# Patient Record
Sex: Female | Born: 1990 | Race: White | Hispanic: No | Marital: Single | Smoking: Never smoker
Health system: Southern US, Community
[De-identification: ages and names within clinical notes are randomized; demographics above are authoritative.]

## PROBLEM LIST (undated history)

## (undated) DIAGNOSIS — G589 Mononeuropathy, unspecified: Secondary | ICD-10-CM

## (undated) HISTORY — PX: ADENOIDECTOMY: SUR15

## (undated) HISTORY — PX: MYRINGOTOMY: SHX2060

## (undated) HISTORY — PX: TONSILLECTOMY: SUR1361

## (undated) HISTORY — PX: TONSILECTOMY, ADENOIDECTOMY, BILATERAL MYRINGOTOMY AND TUBES: SHX2538

---

## 2011-01-01 ENCOUNTER — Emergency Department (HOSPITAL_COMMUNITY)
Admission: EM | Admit: 2011-01-01 | Discharge: 2011-01-01 | Disposition: A | Discharge status: Home / Self Care | Payer: PRIVATE HEALTH INSURANCE | Attending: Emergency Medicine | Admitting: Emergency Medicine

## 2011-01-01 DIAGNOSIS — R55 Syncope and collapse: Secondary | ICD-10-CM | POA: Insufficient documentation

## 2011-01-01 LAB — DIFFERENTIAL
Basophils Absolute: 0 10*3/uL (ref 0.0–0.1)
Lymphocytes Relative: 13 % (ref 12–46)
Lymphs Abs: 1.7 10*3/uL (ref 0.7–4.0)
Monocytes Absolute: 0.8 10*3/uL (ref 0.1–1.0)
Monocytes Relative: 6 % (ref 3–12)
Neutro Abs: 10.7 10*3/uL — ABNORMAL HIGH (ref 1.7–7.7)

## 2011-01-01 LAB — BASIC METABOLIC PANEL
BUN: 11 mg/dL (ref 6–23)
GFR calc non Af Amer: >90 mL/min (ref >90)
Glucose, Bld: 93 mg/dL (ref 70–99)
Potassium: 3.9 mEq/L (ref 3.5–5.1)

## 2011-01-01 LAB — CBC
HCT: 38.4 % (ref 36.0–46.0)
Hemoglobin: 13.7 g/dL (ref 12.0–15.0)
MCH: 30.9 pg (ref 26.0–34.0)
MCHC: 35.7 g/dL (ref 30.0–36.0)
MCV: 86.5 fL (ref 78.0–100.0)

## 2011-01-18 ENCOUNTER — Encounter: Payer: Self-pay | Admitting: Emergency Medicine

## 2011-01-18 ENCOUNTER — Other Ambulatory Visit: Payer: Self-pay

## 2011-01-18 ENCOUNTER — Emergency Department (HOSPITAL_COMMUNITY)
Admission: EM | Admit: 2011-01-18 | Discharge: 2011-01-18 | Disposition: A | Discharge status: Home / Self Care | Payer: PRIVATE HEALTH INSURANCE | Attending: Emergency Medicine | Admitting: Emergency Medicine

## 2011-01-18 DIAGNOSIS — R55 Syncope and collapse: Secondary | ICD-10-CM | POA: Insufficient documentation

## 2011-01-18 DIAGNOSIS — Z9889 Other specified postprocedural states: Secondary | ICD-10-CM | POA: Insufficient documentation

## 2011-01-18 DIAGNOSIS — R42 Dizziness and giddiness: Secondary | ICD-10-CM | POA: Insufficient documentation

## 2011-01-18 DIAGNOSIS — R0602 Shortness of breath: Secondary | ICD-10-CM | POA: Insufficient documentation

## 2011-01-18 LAB — URINALYSIS, ROUTINE W REFLEX MICROSCOPIC
Glucose, UA: NEGATIVE mg/dL
Hgb urine dipstick: NEGATIVE
Protein, ur: NEGATIVE mg/dL

## 2011-01-18 LAB — DIFFERENTIAL
Eosinophils Relative: 2 % (ref 0–5)
Lymphocytes Relative: 43 % (ref 12–46)
Lymphs Abs: 3 10*3/uL (ref 0.7–4.0)
Monocytes Absolute: 0.7 10*3/uL (ref 0.1–1.0)

## 2011-01-18 LAB — POCT I-STAT, CHEM 8
BUN: 7 mg/dL (ref 6–23)
Creatinine, Ser: 0.7 mg/dL (ref 0.50–1.10)
Potassium: 3.3 mEq/L — ABNORMAL LOW (ref 3.5–5.1)
Sodium: 140 mEq/L (ref 135–145)

## 2011-01-18 LAB — CBC
HCT: 34.9 % — ABNORMAL LOW (ref 36.0–46.0)
MCV: 85.1 fL (ref 78.0–100.0)
RBC: 4.1 MIL/uL (ref 3.87–5.11)
WBC: 7 10*3/uL (ref 4.0–10.5)

## 2011-01-18 MED ORDER — SODIUM CHLORIDE 0.9 % IV BOLUS (SEPSIS)
1000.0000 mL | Freq: Once | INTRAVENOUS | Status: AC
  Administered 2011-01-18: 1000 mL via INTRAVENOUS

## 2011-01-18 NOTE — ED Notes (Signed)
Pt states she has not felt right all day  Pt states she has felt weak and tired  Pt states she worked [employer] and had to go outside to get some fresh air a couple times  Pt states she was seen here a few weeks ago for syncope and states tonight she felt like she was going to but did not  Pt states she was home alone and decided to come in for evaluation  Pt states when she stands she becomes dizzy

## 2011-01-18 NOTE — ED Notes (Signed)
Discharging to home

## 2011-01-18 NOTE — ED Notes (Signed)
EKG DONE AND ORTHSTATICS

## 2011-01-18 NOTE — ED Provider Notes (Signed)
History     CSN: [id number] Arrival date & time: 01/18/2011  2:58 AM   First MD Initiated Contact with Patient 01/18/11 [phone]      Chief Complaint  Patient presents with  . Near Syncope  . Shortness of Breath    (Consider location/radiation/quality/duration/timing/severity/associated sxs/prior treatment) Patient is a 20 y.o. female presenting with shortness of breath. The history is provided by the patient.  Shortness of Breath  The problem occurs occasionally. Associated symptoms include shortness of breath. Pertinent negatives include no chest pain.   patient is having episodes of shortness of breath and syncope and near syncope. She seen in ER couple weeks ago for the same thing. No no fevers. No nausea vomiting diarrhea. She states she's lost 50 pounds in the last few months without trying. His been no change her diet. She states that the episodes come on when she is sitting down and stands. She is no chest pain. Her periods have not been heavy. She would like to go is going on.  History reviewed. No pertinent past medical history.  Past Surgical History  Procedure Date  . Tonsillectomy   . Myringotomy   . Adenoidectomy     Family History  Problem Relation Age of Onset  . Hypertension Mother   . Stroke Father   . Heart attack Other     History  Substance Use Topics  . Smoking status: Never Smoker   . Smokeless tobacco: Not on file  . Alcohol Use: No    OB History    Grav Para Term Preterm Abortions TAB SAB Ect Mult Living                  Review of Systems  Constitutional: Positive for unexpected weight change. Negative for activity change and appetite change.  HENT: Negative for neck stiffness.   Eyes: Negative for pain.  Respiratory: Positive for shortness of breath. Negative for chest tightness.   Cardiovascular: Negative for chest pain and leg swelling.  Gastrointestinal: Negative for nausea, vomiting, abdominal pain and diarrhea.  Genitourinary:  Negative for flank pain.  Musculoskeletal: Negative for back pain.  Skin: Negative for rash.  Neurological: Positive for light-headedness. Negative for dizziness, weakness, numbness and headaches.  Psychiatric/Behavioral: Negative for behavioral problems.    Allergies  Review of patient's allergies indicates no known allergies.  Home Medications  No current outpatient prescriptions on file.  BP 117/72  Pulse 60  Temp(Src) 99.1 F (37.3 C) (Oral)  Resp 16  SpO2 100%  LMP 01/12/2011  Physical Exam  Nursing note and vitals reviewed. Constitutional: She is oriented to person, place, and time. She appears well-developed and well-nourished.  HENT:  Head: Normocephalic and atraumatic.  Eyes: EOM are normal. Pupils are equal, round, and reactive to light.  Neck: Normal range of motion. Neck supple. No thyromegaly present.  Cardiovascular: Normal rate, regular rhythm and normal heart sounds.   No murmur heard. Pulmonary/Chest: Effort normal and breath sounds normal. No respiratory distress. She has no wheezes. She has no rales.  Abdominal: Soft. Bowel sounds are normal. She exhibits no distension. There is no tenderness. There is no rebound and no guarding.  Musculoskeletal: Normal range of motion.  Neurological: She is alert and oriented to person, place, and time. No cranial nerve deficit.  Skin: Skin is warm and dry.  Psychiatric: She has a normal mood and affect. Her speech is normal.    ED Course  Procedures (including critical care time)  Labs Reviewed  URINALYSIS,  ROUTINE W REFLEX MICROSCOPIC - Abnormal; Notable for the following:    Ketones, ur TRACE (*)    All other components within normal limits  POCT I-STAT, CHEM 8 - Abnormal; Notable for the following:    Potassium 3.3 (*)    All other components within normal limits  CBC - Abnormal; Notable for the following:    HCT 34.9 (*)    All other components within normal limits  PREGNANCY, URINE  D-DIMER, QUANTITATIVE    DIFFERENTIAL  MONONUCLEOSIS SCREEN  I-STAT, CHEM 8   No results found.   1. Vasovagal near syncope      Date: 01/18/2011  Rate: 63  Rhythm: normal sinus rhythm  QRS Axis: normal  Intervals: normal  ST/T Wave abnormalities: normal  Conduction Disutrbances:none  Narrative Interpretation:   Old EKG Reviewed: none available    MDM  Patient presents to the ER with episodes of syncope and near syncope after standing. She's been seen in t starthe ER for the same previously. She has some shortness of breath with the episodes. No chest pain. She states it may come on more when she eats less. No nausea or vomiting. No fevers. She's not pregnant. Her EKG is reassuring. If her d-dimer was negative. Her white count has gone from 13 down to normal. You had had it is likely near syncope.        Juliet Rude. Rubin Payor, MD 01/18/11 [phone]

## 2011-01-18 NOTE — ED Notes (Signed)
RAINBOW on hold in lab

## 2011-05-06 ENCOUNTER — Encounter (INDEPENDENT_AMBULATORY_CARE_PROVIDER_SITE_OTHER): Payer: PRIVATE HEALTH INSURANCE | Admitting: Obstetrics and Gynecology

## 2011-05-06 DIAGNOSIS — Z01419 Encounter for gynecological examination (general) (routine) without abnormal findings: Secondary | ICD-10-CM

## 2011-05-08 ENCOUNTER — Encounter: Payer: PRIVATE HEALTH INSURANCE | Admitting: Obstetrics and Gynecology

## 2011-07-20 ENCOUNTER — Emergency Department (HOSPITAL_COMMUNITY): Payer: PRIVATE HEALTH INSURANCE

## 2011-07-20 ENCOUNTER — Emergency Department (HOSPITAL_COMMUNITY)
Admission: EM | Admit: 2011-07-20 | Discharge: 2011-07-20 | Disposition: A | Discharge status: Home / Self Care | Payer: PRIVATE HEALTH INSURANCE | Attending: Emergency Medicine | Admitting: Emergency Medicine

## 2011-07-20 ENCOUNTER — Encounter (HOSPITAL_COMMUNITY): Payer: Self-pay | Admitting: *Deleted

## 2011-07-20 DIAGNOSIS — M25551 Pain in right hip: Secondary | ICD-10-CM

## 2011-07-20 DIAGNOSIS — M25559 Pain in unspecified hip: Secondary | ICD-10-CM | POA: Insufficient documentation

## 2011-07-20 HISTORY — DX: Mononeuropathy, unspecified: G58.9

## 2011-07-20 MED ORDER — IBUPROFEN 800 MG PO TABS
800.0000 mg | ORAL_TABLET | Freq: Once | ORAL | Status: AC
  Administered 2011-07-20: 800 mg via ORAL
  Filled 2011-07-20: qty 1

## 2011-07-20 MED ORDER — HYDROCODONE-ACETAMINOPHEN 5-325 MG PO TABS: 1.0000 | ORAL_TABLET | ORAL | Status: AC | PRN

## 2011-07-20 NOTE — Discharge Instructions (Signed)
Read the information below.  Take the pain medication as needed.  You may also take up to 800mg  ibuprofen (4 regular pills) three times a day if you need extra pain control.  Do not take additional tylenol while taking the prescribed pain medicine.  If you continue to have pain this week, follow up with your primary care provider.  If it gets worse, you may also contact the orthopedic surgeon listed above.  You may return to the ER at any time for worsening condition or any new symptoms that concern you.   Arthralgia Your caregiver has diagnosed you as suffering from an arthralgia. Arthralgia means there is pain in a joint. This can come from many reasons including:  Bruising the joint which causes soreness (inflammation) in the joint.   Wear and tear on the joints which occur as we grow older (osteoarthritis).   Overusing the joint.   Various forms of arthritis.   Infections of the joint.  Regardless of the cause of pain in your joint, most of these different pains respond to anti-inflammatory drugs and rest. The exception to this is when a joint is infected, and these cases are treated with antibiotics, if it is a bacterial infection. HOME CARE INSTRUCTIONS   Rest the injured area for as long as directed by your caregiver. Then slowly start using the joint as directed by your caregiver and as the pain allows. Crutches as directed may be useful if the ankles, knees or hips are involved. If the knee was splinted or casted, continue use and care as directed. If an stretchy or elastic wrapping bandage has been applied today, it should be removed and re-applied every 3 to 4 hours. It should not be applied tightly, but firmly enough to keep swelling down. Watch toes and feet for swelling, bluish discoloration, coldness, numbness or excessive pain. If any of these problems (symptoms) occur, remove the ace bandage and re-apply more loosely. If these symptoms persist, contact your caregiver or return to  this location.   For the first 24 hours, keep the injured extremity elevated on pillows while lying down.   Apply ice for 15 to 20 minutes to the sore joint every couple hours while awake for the first half day. Then 3 to 4 times per day for the first 48 hours. Put the ice in a plastic bag and place a towel between the bag of ice and your skin.   Wear any splinting, casting, elastic bandage applications, or slings as instructed.   Only take over-the-counter or prescription medicines for pain, discomfort, or fever as directed by your caregiver. Do not use aspirin immediately after the injury unless instructed by your physician. Aspirin can cause increased bleeding and bruising of the tissues.   If you were given crutches, continue to use them as instructed and do not resume weight bearing on the sore joint until instructed.  Persistent pain and inability to use the sore joint as directed for more than 2 to 3 days are warning signs indicating that you should see a caregiver for a follow-up visit as soon as possible. Initially, a hairline fracture (break in bone) may not be evident on X-rays. Persistent pain and swelling indicate that further evaluation, non-weight bearing or use of the joint (use of crutches or slings as instructed), or further X-rays are indicated. X-rays may sometimes not show a small fracture until a week or 10 days later. Make a follow-up appointment with your own caregiver or one to whom  we have referred you. A radiologist (specialist in reading X-rays) may read your X-rays. Make sure you know how you are to obtain your X-ray results. Do not assume everything is normal if you do not hear from [place]. SEEK MEDICAL CARE IF: Bruising, swelling, or pain increases. SEEK IMMEDIATE MEDICAL CARE IF:   Your fingers or toes are numb or blue.   The pain is not responding to medications and continues to stay the same or get worse.   The pain in your joint becomes severe.   You develop a  fever over 102 F (38.9 C).   It becomes impossible to move or use the joint.  MAKE SURE YOU:   Understand these instructions.   Will watch your condition.   Will get help right away if you are not doing well or get worse.  Document Released: 02/24/2005 Document Revised: 02/13/2011 Document Reviewed: 10/13/2007 [facility] Patient Information 2012 [place], [place].  Hip Pain The hips join the upper legs to the lower pelvis. The bones, cartilage, tendons, and muscles of the hip joint perform a lot of work each day holding your body weight and allowing you to move around. Hip pain is a common symptom. It can range from a minor ache to severe pain on 1 or both hips. Pain may be felt on the inside of the hip joint near the groin, or the outside near the buttocks and upper thigh. There may be swelling or stiffness as well. It occurs more often when a person walks or performs activity. There are many reasons hip pain can develop. CAUSES  It is important to work with your caregiver to identify the cause since many conditions can impact the bones, cartilage, muscles, and tendons of the hips. Causes for hip pain include:  Broken (fractured) bones.   Separation of the thighbone from the hip socket (dislocation).   Torn cartilage of the hip joint.   Swelling (inflammation) of a tendon (tendonitis), the sac within the hip joint (bursitis), or a joint.   A weakening in the abdominal wall (hernia), affecting the nerves to the hip.   Arthritis in the hip joint or lining of the hip joint.   Pinched nerves in the back, hip, or upper thigh.   A bulging disc in the spine (herniated disc).   Rarely, bone infection or cancer.  DIAGNOSIS  The location of your hip pain will help your caregiver understand what may be causing the pain. A diagnosis is based on your medical history, your symptoms, results from your physical exam, and results from diagnostic tests. Diagnostic tests may include X-ray exams, a  computerized magnetic scan (magnetic resonance imaging, MRI), or bone scan. TREATMENT  Treatment will depend on the cause of your hip pain. Treatment may include:  Limiting activities and resting until symptoms improve.   Crutches or other walking supports (a cane or brace).   Ice, elevation, and compression.   Physical therapy or home exercises.   Shoe inserts or special shoes.   Losing weight.   Medications to reduce pain.   Undergoing surgery.  HOME CARE INSTRUCTIONS   Only take over-the-counter or prescription medicines for pain, discomfort, or fever as directed by your caregiver.   Put ice on the injured area:   Put ice in a plastic bag.   Place a towel between your skin and the bag.   Leave the ice on for 15 to 20 minutes at a time, 3 to 4 times a day.   Keep your  leg raised (elevated) when possible to lessen swelling.   Avoid activities that cause pain.   Follow specific exercises as directed by your caregiver.   Sleep with a pillow between your legs on your most comfortable side.   Record how often you have hip pain, the location of the pain, and what it feels like. This information may be helpful to you and your caregiver.   Ask your caregiver about returning to work or sports and whether you should drive.   Follow up with your caregiver for further exams, therapy, or testing as directed.  SEEK MEDICAL CARE IF:   Your pain or swelling continues or worsens after 1 week.   You are feeling unwell or have chills.   You have increasing difficulty with walking.   You have a loss of sensation or other new symptoms.   You have questions or concerns.  SEEK IMMEDIATE MEDICAL CARE IF:   You cannot put weight on the affected hip.   You have fallen.   You have a sudden increase in pain and swelling in your hip.   You have a fever.  MAKE SURE YOU:   Understand these instructions.   Will watch your condition.   Will get help right away if you are not  doing well or get worse.  Document Released: 08/14/2009 Document Revised: 02/13/2011 Document Reviewed: 08/14/2009 [facility] Patient Information 2012 [place], [place].  If you have no primary doctor, here are some resources that may be helpful:  Medicaid-accepting [facility] Providers:   - [facility]- [address]      [phone]      Mon-Fri 9am-7pm, Sat 9am-1pm   - [facility]- [address] [place]      [phone]   - [facility]- [address], [address] [place]      [phone]   [facility]- [address]      [phone]   - Renaye Rakers- [address] [place], [address]      [phone]      Only accepts [place] patients       after they have her name applied to their card   Self Pay (no insurance) in [place]:   - Sickle Cell Patients: Dr Willey Blade, [facility]      [address] [place]      [phone]   - [phone]   - Physician Referral Service- [phone]   - [facility]- [address] [place]      [phone]   [facility] [place]- [address]   - [facility]- see information above      (Speak to [employer] if you do not have insurance)   - [facility]- [address] [place]      [phone]   - [facility] [employer]- [address]      [phone]   - [facility]- [address]      [phone]   - Dr Julio Sicks-  [address], [place]      [phone]   - [facility]- [address]      [phone]   - [facility]- [address]      [phone]      Also [address]      [phone]   - [facility]- [address]      [phone]      1st and 3rd Saturday every month, 10am-1pm Other agencies that provide inexpensive medical care:    [facility]  [phone]    [facility] Internal  Medicine  [phone]    [facility]  [phone]    [facility]  [phone]    [facility]  [phone]  General Information: Finding a doctor when you do not have health insurance can be tricky. Although you are not limited by an insurance plan, you are of course limited by her finances and how much but he can pay out of pocket.  What are your options if you don't have health insurance?   1) Find a Librarian, academic and Pay Out of Pocket Although you won't have to find out who is covered by your insurance plan, it is a good idea to ask around and get recommendations. You will then need to call the office and see if the doctor you have chosen will accept you as a new patient and what types of options they offer for patients who are self-pay. Some doctors offer discounts or will set up payment plans for their patients who do not have insurance, but you will need to ask so you aren't surprised when you get to your appointment.  2) Contact Your Local Health Department Not all health departments have doctors that can see patients for sick visits, but many do, so it is worth a call to see if yours does. If you don't know where your local health department is, you can check in your phone book. The CDC also has a tool to help you locate your state's health department, and many state websites also have listings of all of their local health departments.  3) Find a Walk-in Clinic If your illness is not likely to be very severe or complicated, you may want to try a walk in clinic. These are popping up all over the country in pharmacies, drugstores, and shopping centers. They're usually staffed by nurse practitioners or physician assistants that have been trained to treat common illnesses and complaints. They're usually fairly quick and inexpensive. However, if you have serious medical issues or chronic medical problems, these are probably not your best option  RESOURCE GUIDE  Dental Problems  Patients  with Medicaid: [facility] [phone] [address]                                           [phone] [address] [employer] [address] Phone:  [phone]                                                  Phone:  [phone]  If unable to pay or uninsured, contact:  [facility] or [facility]. to become qualified for the adult dental clinic.  Chronic Pain Problems Contact [facility]  [phone] Patients need to be referred by their primary care doctor.  Insufficient Money for Medicine Contact United Way:  call "211" or [facility] [phone].  No Primary Care Doctor Call [facility]  [phone] Other agencies that provide inexpensive medical care    [facility]  [phone]    [facility]  [phone]    [facility]  [phone]    [facility]  [phone]    [facility]  [phone]    [facility]  [phone]  Psychological Services [facility]  [phone] [facility]  [phone] [facility]   [phone] (emergency services [phone])  Substance Abuse Resources [facility]  [phone] [facility] [phone] [facility] [place] [phone] Floydene Flock [phone] Residential & Outpatient Substance Abuse Program  [phone]  Abuse/Neglect [facility] Child Abuse Hotline (401)595-0895 [facility] Child Abuse Hotline 207-243-7027 (After Hours)  Emergency [facility] (430)570-2153  Maternity [facility] [place] [facility] [phone] [facility] Services [phone]  MRSA Hotline #:   801-469-3957    [facility] Resources  [facility]     [employer]                          [facility] Dept. [address] [place]                       [address]      [address]  Blondell Reveal Phone:  [phone]                                   Phone:  [phone]                 Phone:  [phone]  [facility] Mental Health Phone:  [phone]  [facility] Child Abuse Hotline 716-851-0532 3061433200 (After Hours)

## 2011-07-20 NOTE — ED Provider Notes (Signed)
History     CSN: [id number]  Arrival date & time 07/20/11  1313   First MD Initiated Contact with Patient 07/20/11 1431      Chief Complaint  Patient presents with  . Leg Pain    right    (Consider location/radiation/quality/duration/timing/severity/associated sxs/prior treatment) HPI Comments: Patient reports she began having right hip and right thigh pain last night.  Pain is aching and sharp, worse with movement.  Denies injury, fevers, recent illness.  Pt has no medical problems, was treated for chlamydia 5 months ago but since has not had any abnormal vaginal discharge or concerning symptoms.  Denies leg swelling, no family or personal hx blood clots, no recent immobilization.  Denies weakness or numbness of her legs.    The history is provided by the patient.    Past Medical History  Diagnosis Date  . Pinched nerve     Past Surgical History  Procedure Date  . Tonsillectomy   . Myringotomy   . Adenoidectomy     Family History  Problem Relation Age of Onset  . Hypertension Mother   . Stroke Father   . Heart attack Other     History  Substance Use Topics  . Smoking status: Current Everyday Smoker -- 0.5 packs/day    Types: Cigarettes  . Smokeless tobacco: Never Used  . Alcohol Use: Yes     once a week    OB History    Grav Para Term Preterm Abortions TAB SAB Ect Mult Living                  Review of Systems  Constitutional: Negative for fever.  HENT: Negative for sore throat.   Respiratory: Negative for cough.   Genitourinary: Negative for vaginal discharge.  Neurological: Negative for weakness and numbness.    Allergies  Review of patient's allergies indicates no known allergies.  Home Medications   Current Outpatient Rx  Name Route Sig Dispense Refill  . LEVONORGESTREL-ETHINYL ESTRAD 0.1-20 MG-MCG PO TABS Oral Take 1 tablet by mouth daily.      BP 144/75  Pulse 90  Temp(Src) 98.2 F (36.8 C) (Oral)  Resp 18  Wt 108 lb (48.988 kg)   SpO2 100%  LMP 05/04/2011  Physical Exam  Nursing note and vitals reviewed. Constitutional: She is oriented to person, place, and time. She appears well-developed and well-nourished.  HENT:  Head: Normocephalic and atraumatic.  Neck: Neck supple.  Pulmonary/Chest: Effort normal.  Musculoskeletal: She exhibits no edema.       Right hip: She exhibits decreased range of motion. She exhibits normal strength, no bony tenderness, no swelling, no crepitus, no deformity and no laceration.       Right knee: Normal.       Right ankle: Normal.       Cervical back: Normal.       Thoracic back: Normal.       Lumbar back: Normal.       Right lower leg: She exhibits no tenderness and no swelling.       Legs:      Right hip pain with abduction.    Lower extremities: strength 5/5, sensation intact, distal pulses intact.   Neurological: She is alert and oriented to person, place, and time.  Skin: No rash noted. She is not diaphoretic.  Psychiatric: She has a normal mood and affect. Her behavior is normal. Judgment and thought content normal.    ED Course  Procedures (including critical care time)  Labs Reviewed - No data to display Dg Hip Complete Right  07/20/2011  *RADIOLOGY REPORT*  Clinical Data: Leg pain, hip pain  RIGHT HIP - COMPLETE 2+ VIEW  Comparison: None.  Findings: Three views of the right hip submitted.  No acute fracture or subluxation.  IMPRESSION: No acute fracture or subluxation.  Original Report Authenticated By: Natasha Mead, M.D.     1. Right hip pain       MDM  Patient with right hip pain that began last night.  No injury, no fever, no recent illness.  Doubt septic joint.  No swelling or tenderness or redness concerning for DVT.  Mild muscular tenderness and pain with Abduction of right hip.  Xray negative.  Pt d/c home with pain medication, PCP/ortho follow up.  Return precautions discussed.  Patient verbalizes understanding and agrees with plan.          Dillard Cannon  [place], [place] 07/20/11 1559

## 2011-07-20 NOTE — ED Notes (Signed)
Pt from home with reports of right hip pain with radiation up and down right leg and body. Pt reports that pain was first noticed last night but radiation began this morning. Pt denies injury or surgery.

## 2011-07-29 NOTE — ED Provider Notes (Signed)
Medical screening examination/treatment/procedure(s) were performed by non-physician practitioner and as supervising physician I was immediately available for consultation/collaboration.    Nelia Shi, MD 07/29/11 1640

## 2012-10-05 ENCOUNTER — Encounter (HOSPITAL_BASED_OUTPATIENT_CLINIC_OR_DEPARTMENT_OTHER): Payer: Self-pay | Admitting: Emergency Medicine

## 2012-10-05 ENCOUNTER — Emergency Department (HOSPITAL_BASED_OUTPATIENT_CLINIC_OR_DEPARTMENT_OTHER)
Admission: EM | Admit: 2012-10-05 | Discharge: 2012-10-05 | Disposition: A | Discharge status: Home / Self Care | Payer: Worker's Compensation | Attending: Emergency Medicine | Admitting: Emergency Medicine

## 2012-10-05 ENCOUNTER — Emergency Department (HOSPITAL_BASED_OUTPATIENT_CLINIC_OR_DEPARTMENT_OTHER): Payer: Worker's Compensation

## 2012-10-05 DIAGNOSIS — F17200 Nicotine dependence, unspecified, uncomplicated: Secondary | ICD-10-CM | POA: Insufficient documentation

## 2012-10-05 DIAGNOSIS — Z79899 Other long term (current) drug therapy: Secondary | ICD-10-CM | POA: Insufficient documentation

## 2012-10-05 DIAGNOSIS — Z8669 Personal history of other diseases of the nervous system and sense organs: Secondary | ICD-10-CM | POA: Insufficient documentation

## 2012-10-05 DIAGNOSIS — S51009A Unspecified open wound of unspecified elbow, initial encounter: Secondary | ICD-10-CM | POA: Insufficient documentation

## 2012-10-05 DIAGNOSIS — Y990 Civilian activity done for income or pay: Secondary | ICD-10-CM | POA: Insufficient documentation

## 2012-10-05 DIAGNOSIS — S51012A Laceration without foreign body of left elbow, initial encounter: Secondary | ICD-10-CM

## 2012-10-05 DIAGNOSIS — Y92009 Unspecified place in unspecified non-institutional (private) residence as the place of occurrence of the external cause: Secondary | ICD-10-CM | POA: Insufficient documentation

## 2012-10-05 DIAGNOSIS — W1809XA Striking against other object with subsequent fall, initial encounter: Secondary | ICD-10-CM | POA: Insufficient documentation

## 2012-10-05 DIAGNOSIS — Y9389 Activity, other specified: Secondary | ICD-10-CM | POA: Insufficient documentation

## 2012-10-05 MED ORDER — OXYCODONE-ACETAMINOPHEN 5-325 MG PO TABS
2.0000 | ORAL_TABLET | Freq: Once | ORAL | Status: DC
  Filled 2012-10-05: qty 2

## 2012-10-05 MED ORDER — LIDOCAINE-EPINEPHRINE 2 %-1:100000 IJ SOLN
INTRAMUSCULAR | Status: AC
  Filled 2012-10-05: qty 1

## 2012-10-05 MED ORDER — OXYCODONE-ACETAMINOPHEN 5-325 MG PO TABS
ORAL_TABLET | ORAL | Status: AC
  Filled 2012-10-05: qty 2

## 2012-10-05 NOTE — ED Notes (Signed)
Pt reports falling at work, walking into kitchen area, slipped on water and fell to floor, denies hitting head, states she fell on buttocks, no leg back or neck pain

## 2012-10-05 NOTE — ED Provider Notes (Signed)
CSN: [id number]     Arrival date & time 10/05/12  2027 History     First MD Initiated Contact with Patient 10/05/12 2108     Chief Complaint  Patient presents with  . Laceration   (Consider location/radiation/quality/duration/timing/severity/associated sxs/prior Treatment) Patient is a 22 y.o. female presenting with skin laceration.  Laceration  Pt reports she slipped and fell at work just prior to arrival landing on her buttocks and hitting her L elbow on the ground, sustaining laceration to L elbow. Complaining of moderate to severe aching pain worse with movement.  No head injury or LOC.   Past Medical History  Diagnosis Date  . Pinched nerve    Past Surgical History  Procedure Laterality Date  . Tonsillectomy    . Myringotomy    . Adenoidectomy     Family History  Problem Relation Age of Onset  . Hypertension Mother   . Stroke Father   . Heart attack Other    History  Substance Use Topics  . Smoking status: Current Every Day Smoker -- 0.50 packs/day    Types: Cigarettes  . Smokeless tobacco: Never Used  . Alcohol Use: Yes     Comment: once a week   OB History   Grav Para Term Preterm Abortions TAB SAB Ect Mult Living                 Review of Systems All other systems reviewed and are negative except as noted in HPI.   Allergies  Review of patient's allergies indicates no known allergies.  Home Medications   Current Outpatient Rx  Name  Route  Sig  Dispense  Refill  . levonorgestrel-ethinyl estradiol (AVIANE,ALESSE,LESSINA) 0.1-20 MG-MCG tablet   Oral   Take 1 tablet by mouth daily.          BP 111/99  Pulse 86  Temp(Src) 98.4 F (36.9 C) (Oral)  Resp 16  Ht 5\' 4"  (1.626 m)  Wt 107 lb (48.535 kg)  BMI 18.36 kg/m2  SpO2 100%  LMP 09/14/2012 Physical Exam  Nursing note and vitals reviewed. Constitutional: She is oriented to person, place, and time. She appears well-developed and well-nourished.  HENT:  Head: Normocephalic and atraumatic.   Eyes: EOM are normal. Pupils are equal, round, and reactive to light.  Neck: Normal range of motion. Neck supple.  Cardiovascular: Normal rate, normal heart sounds and intact distal pulses.   Pulmonary/Chest: Effort normal and breath sounds normal.  Abdominal: Bowel sounds are normal. She exhibits no distension. There is no tenderness.  Musculoskeletal: She exhibits tenderness.  Laceration x 2 L posterior elbow; pain with ROM  Neurological: She is alert and oriented to person, place, and time. She has normal strength. No cranial nerve deficit or sensory deficit.  Skin: Skin is warm and dry. No rash noted.  Psychiatric: She has a normal mood and affect.    ED Course   Procedures (including critical care time) LACERATION REPAIR #1 Performed by: Pollyann Savoy. Consent: Verbal consent obtained. Risks and benefits: risks, benefits and alternatives were discussed Patient identity confirmed: provided demographic data Time out performed prior to procedure Prepped and Draped in normal sterile fashion Wound explored Laceration Location: L elbow Laceration Length: 2cm No Foreign Bodies seen or palpated Anesthesia: local infiltration Local anesthetic: lidocaine 2% with epinephrine Anesthetic total: 2 ml Irrigation method: syringe Amount of cleaning: standard Skin closure: 4-0 nylon Number of sutures or staples: 1 Technique: horizontal mattress Patient tolerance: Patient tolerated the procedure well with no  immediate complications.  LACERATION REPAIR #2 Performed by: Pollyann Savoy. Consent: Verbal consent obtained. Risks and benefits: risks, benefits and alternatives were discussed Patient identity confirmed: provided demographic data Time out performed prior to procedure Prepped and Draped in normal sterile fashion Wound explored Laceration Location: L elbow Laceration Length: 2cm No Foreign Bodies seen or palpated Anesthesia: local infiltration Local anesthetic:  lidocaine 2% with epinephrine Anesthetic total: 2 ml Irrigation method: syringe Amount of cleaning: standard Skin closure: 4-0 nylon Number of sutures or staples: 1 Technique: horizontal mattress Patient tolerance: Patient tolerated the procedure well with no immediate complications. Labs Reviewed - No data to display Dg Elbow Complete Left  10/05/2012   *RADIOLOGY REPORT*  Clinical Data: Laceration secondary to fall at work.  LEFT ELBOW - COMPLETE 3+ VIEW  Comparison: None.  Findings: There is a laceration overlying the olecranon process. No radiodense foreign body.  No osseous abnormality including fracture.  No joint effusion.  IMPRESSION: Posterior elbow laceration.  No radiodense foreign body or osseous abnormality.   Original Report Authenticated By: Tiburcio Pea   1. Laceration of left elbow, initial encounter     MDM  Lacerations not into deep structures. Wound care instructions given.   Romey Cohea B. Bernette Mayers, MD 10/05/12 2314

## 2012-10-05 NOTE — ED Notes (Signed)
Pt only wanted one percocet. Given one percocet and other returned

## 2013-03-10 NOTE — L&D Delivery Note (Signed)
Delivery Information for Girl [place]    Labor Events:    Preterm labor: No   Cervical ripening:                         Rupture date: 11/21/2013   Rupture time: 12:05 AM   Rupture type: Intact;Spontaneous   Fluid Color: Clear   Fluid Odor         Risk Factors:                                  None            Delivery Complications:                                         None         Vaginal Delivery Counts    Initial Count Correct?:      Next Count Correct?:      Relief Count Correct?:      Other Count Correct?:     Final Count Correct?:          Presentation/Position     Breech                   Delivery (Newborn)    FHR in O.R.: 180     Delivery Rm Temp: 66     Date of birth: 11/21/2013   Time of birth: 3:42 AM   Sex: female   GA: Gestational Age: [redacted]w[redacted]d   Delivery Type: Cesarean         C/S Incidence:  Primary      C/S Incision Type:  Lower Uterine Transverse           VBAC Attempted:  No          Delivery Assist by:     Number of pulls:     Vacuum Type:     Forceps Type:         Delivery Maneuvers    Non-Dystocia Maneuver:                            :         Delivery (Maternal)    Episiotomy: None   Lacerations: None     Laceration Type:      Episiotomy/Laceration Repair: N/A   Blood Loss (mL): 500   Uterus Explored: Yes     Anesthesia Method: Spinal            Newborn Assessment    Living at birth: Yes          APGARS  One minute Five minutes Ten minutes   Skin color: 1   1       Heart rate: 2   2       Grimace: 2   2       Muscle tone: 2   2       Breathing: 2   2       Totals: 9  9              Resuscitation    Resuscitation: Tactile Stimulation          Suction: Bulb     Prior to DEL of chest:  Newborn Measurements    Weight: 7 lb 13.6 oz (3560 g)   Length: 20.5"   Observed Anomalies:        Cord Information    Vessels: 3 vessels   Complications: None   Nuchal Cord:     Cut before DEL of shoulder:     Blood gases sent? No   Cord Tissue Sample: No         Placenta Information    Placenta Removal:  Manual removal     Placenta Appearance: Intact       Culture/Pathology    Cultures obtained: None          Pathology Specimens: None                    Lorenza Evangelist MD

## 2013-09-11 LAB — RPR: RPR: NONREACTIVE

## 2013-09-11 LAB — GONOCOCCUS CULTURE
Chlamydia trachomatis Culture: NEGATIVE
Culture Gonorrhoeae: NEGATIVE

## 2013-09-11 LAB — HEPATITIS B SURFACE ANTIGEN W/ REFLEX TO CONFIRMATION: Hepatitis B Surface Antigen: NEGATIVE

## 2013-09-11 LAB — HIV AG/AB 4TH GENERATION: HIV Ag/Ab, 4th Generation: NEGATIVE

## 2013-09-11 LAB — GROUP B STREP TRANSCRIBED: GBS Transcribed: POSITIVE

## 2013-09-11 LAB — RUBELLA ANTIBODY, IGG: Rubella AB, IgG: IMMUNE

## 2013-11-11 ENCOUNTER — Observation Stay: Payer: Medicaid Other

## 2013-11-11 ENCOUNTER — Observation Stay
Admission: AD | Admit: 2013-11-11 | Discharge: 2013-11-11 | Disposition: A | Discharge status: Home / Self Care | Payer: Medicaid Other | Source: Ambulatory Visit | Attending: Obstetrics and Gynecology | Admitting: Obstetrics and Gynecology

## 2013-11-11 ENCOUNTER — Observation Stay: Payer: Medicaid Other | Admitting: Obstetrics and Gynecology

## 2013-11-11 DIAGNOSIS — O139 Gestational [pregnancy-induced] hypertension without significant proteinuria, unspecified trimester: Principal | ICD-10-CM | POA: Insufficient documentation

## 2013-11-11 DIAGNOSIS — Z3480 Encounter for supervision of other normal pregnancy, unspecified trimester: Secondary | ICD-10-CM

## 2013-11-11 LAB — URIC ACID: Uric acid: 6.4 mg/dL — ABNORMAL HIGH (ref 2.6–6.0)

## 2013-11-11 LAB — CBC AND DIFFERENTIAL
Basophils Absolute Automated: 0.02 10*3/uL (ref 0.00–0.20)
Basophils Automated: 0 %
Eosinophils Absolute Automated: 0.18 10*3/uL (ref 0.00–0.70)
Eosinophils Automated: 2 %
Hematocrit: 32.6 % — ABNORMAL LOW (ref 37.0–47.0)
Hgb: 10.8 g/dL — ABNORMAL LOW (ref 12.0–16.0)
Immature Granulocytes Absolute: 0.06 10*3/uL — ABNORMAL HIGH
Immature Granulocytes: 1 %
Lymphocytes Absolute Automated: 2.46 10*3/uL (ref 0.50–4.40)
Lymphocytes Automated: 22 %
MCH: 27.8 pg — ABNORMAL LOW (ref 28.0–32.0)
MCHC: 33.1 g/dL (ref 32.0–36.0)
MCV: 83.8 fL (ref 80.0–100.0)
MPV: 10.9 fL (ref 9.4–12.3)
Monocytes Absolute Automated: 0.86 10*3/uL (ref 0.00–1.20)
Monocytes: 8 %
Neutrophils Absolute: 7.76 10*3/uL (ref 1.80–8.10)
Neutrophils: 69 %
Nucleated RBC: 0 /100 WBC (ref 0–1)
Platelets: 236 10*3/uL (ref 140–400)
RBC: 3.89 10*6/uL — ABNORMAL LOW (ref 4.20–5.40)
RDW: 14 % (ref 12–15)
WBC: 11.28 10*3/uL — ABNORMAL HIGH (ref 3.50–10.80)

## 2013-11-11 LAB — URINALYSIS WITH MICROSCOPIC
Bilirubin, UA: NEGATIVE
Blood, UA: NEGATIVE
Glucose, UA: NEGATIVE
Ketones UA: NEGATIVE
Leukocyte Esterase, UA: NEGATIVE
Nitrite, UA: NEGATIVE
Protein, UR: NEGATIVE
Specific Gravity UA: 1.006 (ref 1.001–1.035)
Urine pH: 6 (ref 5.0–8.0)
Urobilinogen, UA: NEGATIVE mg/dL

## 2013-11-11 LAB — GFR: EGFR: >60

## 2013-11-11 LAB — COMPREHENSIVE METABOLIC PANEL
ALT: 8 U/L (ref 0–55)
AST (SGOT): 20 U/L (ref 5–34)
Albumin/Globulin Ratio: 0.8 — ABNORMAL LOW (ref 0.9–2.2)
Albumin: 2.7 g/dL — ABNORMAL LOW (ref 3.5–5.0)
Alkaline Phosphatase: 140 U/L — ABNORMAL HIGH (ref 37–106)
Anion Gap: 9 (ref 5.0–15.0)
BUN: 10 mg/dL (ref 7.0–19.0)
Bilirubin, Total: 0.4 mg/dL (ref 0.2–1.2)
CO2: 20 mEq/L — ABNORMAL LOW (ref 22–29)
Calcium: 9.2 mg/dL (ref 8.5–10.5)
Chloride: 111 mEq/L (ref 100–111)
Creatinine: 0.7 mg/dL (ref 0.6–1.0)
Globulin: 3.6 g/dL (ref 2.0–3.6)
Glucose: 69 mg/dL — ABNORMAL LOW (ref 70–100)
Potassium: 4.1 mEq/L (ref 3.5–5.1)
Protein, Total: 6.3 g/dL (ref 6.0–8.3)
Sodium: 140 mEq/L (ref 136–145)

## 2013-11-11 LAB — PROTEIN / CREATININE RATIO, URINE
Urine Creatinine, Random: 24.1
Urine Protein Random: <7 mg/dL (ref 1.0–14.0)

## 2013-11-11 LAB — PT AND APTT
PT INR: 1 (ref 0.9–1.1)
PT: 13.1 s (ref 12.6–15.0)
PTT: 29 s (ref 23–37)

## 2013-11-11 LAB — FIBRINOGEN: Fibrinogen: 523 mg/dL — ABNORMAL HIGH (ref 189–458)

## 2013-11-11 LAB — LACTATE DEHYDROGENASE: LDH: 180 U/L (ref 125–220)

## 2013-11-11 LAB — HEMOLYSIS INDEX: Hemolysis Index: 4 (ref 0–18)

## 2013-11-11 NOTE — Progress Notes (Signed)
Dr. Katheren Shams called with breech presentation - pt to call on Tuesday to schedule version- afi 14cm- instructions given

## 2013-11-11 NOTE — Discharge Instructions (Signed)
Understanding Preeclampsia  Preeclampsia is a problem that may occur in pregnancy. It can lead to health risks for you and your baby. No one knows what causes preeclampsia. But it almost always goes away soon after you give birth.     Your blood pressure will be monitored regularly throughout your pregnancy to help check for preeclampsia.   Signs and Symptoms  A common sign of preeclampsia is high blood pressure. Other signs and symptoms include:   Edema(swelling) in your face or hands   Rapid weight gain--about 1 pound or more in a day   Protein in your urine   Headache   Abdominal pain on your right side   Vision problems (flashes or spots)  Tests You May Have  Your doctor will want to check your blood pressure throughout your pregnancy. If your blood pressure is high, you may havethe following tests:   Urine tests to look for protein   Blood tests to confirm preeclampsia   Fetal monitoring to ensure that your baby is healthy  Treating Preeclampsia  Preeclampsia almost always ends soon after you give birth. The only cure is delivery. Until then, your doctor can help manage your condition. If your symptoms are mild, you may need bed rest at home. If your symptoms are severe, you will be hospitalized. Hospital treatment includes:   Complete bed rest to help control blood pressure   Magnesium IV (intravenous) drip during labor to prevent seizures   Induced labor or surgical delivery by cesarean section to help you have your baby more quickly  When to Call Your Doctor  Call your doctor or other healthcare provider if swelling, weight gain, or other symptoms come on quickly or are severe. Some cases of preeclampsia are more severe than others. Your signs and symptoms also may change or worsen as you get closer to your due date.  Who's At Risk?  Preeclampsia can occur in any pregnant woman. But if you've had it before, you have a greater chance of it recurring. Also, if you already had high blood  pressure before getting pregnant, your risk for preeclampsia is higher. African Americans, teens, women over 7, and women pregnant with two or more babies are also at greater risk.  Dangers of Preeclampsia  If not treated, preeclampsia can cause problems for you and your baby. The placenta (organ that nourishes your baby) may tear away from the uterine wall. This can lead to fetal distress (the baby is at risk for health problems). Or, your baby may be born too early or too small. Preeclampsia also can cause these health problems:   Kidney failure or other organ damage   Seizures   Stroke  Once You Give Birth  In most cases, preeclampsia goes away on its own soon after you give birth. Within days, your blood pressure should decrease. Other signs and symptoms of preeclampsia also will go away soon.   2000-2014 [employer] [address], [place], [place] [address]. All rights reserved. This information is not intended as a substitute for professional medical care. Always follow your healthcare professional's instructions.        Recognizing Labor  The beginning of labor is the beginning of birth. You'll start to feel strong contractions. That's when the muscles of your uterus tighten up to help push your baby out during birth.    Yes, Labor Has Probably Started If:   Your contractions are getting stronger and more painful instead of weaker. You'll probably  feel them throughout your whole uterus.   Your contractions are regular (you feel them about every 5 to 10 minutes) and they are getting closer together.   You have pink-colored or blood-streaked fluid from your vagina.   Your water breaks. It may be a gush or a slow trickle of clear fluid from your vagina.  No, It's Probably Not Real Labor If:   Your contractions aren't regular or strong.   You feel the contractions only in your lower uterus.   Your contractions go away when you walk or change position.   Your contractions go away after  drinking fluids.  When to Call Your Health Care Provider  Call your doctor or clinic right away if you notice any of these signs:   Fluid from your vagina, with or without contractions.   Bleeding heavy enough that you need a sanitary pad.   You don't feel your baby moving as much as before.    NOTE: Contractions are timed by both of these measures:   The length of each contraction from its start to its finish.   How far apart the contractions are-- the time between the start of one contraction and the start of the next one.    2000-2014 [employer] [address], [place], [place] [address]. All rights reserved. This information is not intended as a substitute for professional medical care. Always follow your healthcare professional's instructions.        Kick Counts  It's normal to worry about your baby's health. One way you can knowyour baby's doing well is to record the baby's movements once a day. This is called a kick count. You will usually feel your baby move by the 20th week of pregnancy. Remember to take your kick count records to all your appointments with your healthcare provider.    How to Count Kicks   Choose a time when the baby is active, such as after a meal.   Sit comfortably or lie on your side.   The first time the baby moves,write downthe time.   Count each movement until the baby has moved10times. This can take from 20 minutes to 2hours.   If the baby hasn't moved 4times in 1 hour,pat your stomach to wake the baby up.   Write down the time you feel the baby's 10thmovement.   Try to do it at the same time each day.  When to Call Your Healthcare Provider  Call your healthcare providerright awayif you notice any of the following:   Your baby moves fewer than 10times in4hours while you're doing kick counts.   Your baby moves much less often than on thedays before.   You have not felt your baby move all day.   2000-2014 [employer]  [address] [place], [place] [address]. All rights reserved. This information is not intended as a substitute for professional medical care. Always follow your healthcare professional's instructions.        FallPrevention  Falls often occur due to slipping, tripping or losing your balance.Here are ways to reduce your risk of falling again.   Was there anything that caused your fall that can be fixed, removed or replaced?   Make your home safe by keeping walkways clear of objects you may trip over.   Use non-slip pads under rugs.   Do not walk in poorly lit areas.   Do not stand on chairs or wobbly ladders.   Use caution when  reaching overhead or looking upward.This position can cause a loss of balance.   Be sure your shoes fit properly, have non-slip bottoms and are in good condition.   Be cautious when going up and down stairs, curbs, and when walking on uneven sidewalks.   If your balance is poor, consider using a cane or walker.   If your fall was related to alcohol use, stop or limit alcohol intake.   If your fall was related to use of sleeping medicines, talk to your doctor about this.You may need to reduce your dosage at bedtime if you awaken during the night to go to the bathroom.   To reduce the need for nighttime bathroom trips:   Avoid drinking fluids for several hours before going to bed   Empty your bladder before going to bed   Men can keep a urinal at the bedside   Stay as active as you can. Balance, flexibility, strength, and endurance all come from exercise. They all play a role in preventing falls. Ask your heathcare provider which types of activity are right for you.   2000-2014 [employer] [address], [place], [place] [address]. All rights reserved. This information is not intended as a substitute for professional medical care. Always follow your healthcare professional's instructions.

## 2013-11-15 ENCOUNTER — Encounter
Admission: AD | Disposition: A | Discharge status: Home / Self Care | Payer: Self-pay | Source: Ambulatory Visit | Attending: Obstetrics and Gynecology

## 2013-11-15 ENCOUNTER — Observation Stay: Payer: Medicaid Other | Admitting: Obstetrics and Gynecology

## 2013-11-15 ENCOUNTER — Observation Stay
Admission: AD | Admit: 2013-11-15 | Discharge: 2013-11-15 | Disposition: A | Discharge status: Home / Self Care | Payer: Medicaid Other | Source: Ambulatory Visit | Attending: Obstetrics and Gynecology | Admitting: Obstetrics and Gynecology

## 2013-11-15 DIAGNOSIS — Z3A38 38 weeks gestation of pregnancy: Secondary | ICD-10-CM

## 2013-11-15 DIAGNOSIS — O321XX0 Maternal care for breech presentation, not applicable or unspecified: Secondary | ICD-10-CM | POA: Insufficient documentation

## 2013-11-15 DIAGNOSIS — O139 Gestational [pregnancy-induced] hypertension without significant proteinuria, unspecified trimester: Principal | ICD-10-CM | POA: Insufficient documentation

## 2013-11-15 DIAGNOSIS — Z3480 Encounter for supervision of other normal pregnancy, unspecified trimester: Secondary | ICD-10-CM

## 2013-11-15 LAB — CBC AND DIFFERENTIAL
Basophils Absolute Automated: 0.02 10*3/uL (ref 0.00–0.20)
Basophils Automated: 0 %
Eosinophils Absolute Automated: 0.15 10*3/uL (ref 0.00–0.70)
Eosinophils Automated: 1 %
Hematocrit: 33.6 % — ABNORMAL LOW (ref 37.0–47.0)
Hgb: 11.2 g/dL — ABNORMAL LOW (ref 12.0–16.0)
Immature Granulocytes Absolute: 0.07 10*3/uL — ABNORMAL HIGH
Immature Granulocytes: 1 %
Lymphocytes Absolute Automated: 2.93 10*3/uL (ref 0.50–4.40)
Lymphocytes Automated: 27 %
MCH: 27.9 pg — ABNORMAL LOW (ref 28.0–32.0)
MCHC: 33.3 g/dL (ref 32.0–36.0)
MCV: 83.8 fL (ref 80.0–100.0)
MPV: 10.8 fL (ref 9.4–12.3)
Monocytes Absolute Automated: 0.82 10*3/uL (ref 0.00–1.20)
Monocytes: 8 %
Neutrophils Absolute: 6.92 10*3/uL (ref 1.80–8.10)
Neutrophils: 64 %
Nucleated RBC: 0 /100 WBC (ref 0–1)
Platelets: 260 10*3/uL (ref 140–400)
RBC: 4.01 10*6/uL — ABNORMAL LOW (ref 4.20–5.40)
RDW: 15 % (ref 12–15)
WBC: 10.84 10*3/uL — ABNORMAL HIGH (ref 3.50–10.80)

## 2013-11-15 LAB — PROTEIN, URINE, 24 HOUR: Urine Volume:: 5250 mL

## 2013-11-15 LAB — CREATININE CLEARANCE
Body Surface Area:: 1.67
Creat Clearance Hours Collected: 24
Creat Clearance-Serum Creatinine: 0.8 mg/dL
Patient Height (In): 64
Urine Creat Clearance Calculation: 105 (ref 66–165)
Urine Volume:: 5250 mL
Weight: 140

## 2013-11-15 LAB — PROTEIN / CREATININE RATIO, URINE
Urine Creatinine, Random: 18.2
Urine Protein Random: <7 mg/dL (ref 1.0–14.0)

## 2013-11-15 LAB — COMPREHENSIVE METABOLIC PANEL
ALT: 9 U/L (ref 0–55)
AST (SGOT): 17 U/L (ref 5–34)
Albumin/Globulin Ratio: 0.8 — ABNORMAL LOW (ref 0.9–2.2)
Albumin: 2.7 g/dL — ABNORMAL LOW (ref 3.5–5.0)
Alkaline Phosphatase: 146 U/L — ABNORMAL HIGH (ref 37–106)
Anion Gap: 10 (ref 5.0–15.0)
BUN: 10 mg/dL (ref 7.0–19.0)
Bilirubin, Total: 0.4 mg/dL (ref 0.2–1.2)
CO2: 21 mEq/L — ABNORMAL LOW (ref 22–29)
Calcium: 10.4 mg/dL (ref 8.5–10.5)
Chloride: 106 mEq/L (ref 100–111)
Creatinine: 0.8 mg/dL (ref 0.6–1.0)
Globulin: 3.5 g/dL (ref 2.0–3.6)
Glucose: 63 mg/dL — ABNORMAL LOW (ref 70–100)
Potassium: 3.8 mEq/L (ref 3.5–5.1)
Protein, Total: 6.2 g/dL (ref 6.0–8.3)
Sodium: 137 mEq/L (ref 136–145)

## 2013-11-15 LAB — URINALYSIS WITH MICROSCOPIC
Bilirubin, UA: NEGATIVE
Glucose, UA: NEGATIVE
Ketones UA: NEGATIVE
Leukocyte Esterase, UA: NEGATIVE
Nitrite, UA: NEGATIVE
Protein, UR: NEGATIVE
Specific Gravity UA: 1.005 (ref 1.001–1.035)
Urine pH: 7 (ref 5.0–8.0)
Urobilinogen, UA: NEGATIVE mg/dL

## 2013-11-15 LAB — HEPATIC FUNCTION PANEL
Bilirubin Direct: 0.1 mg/dL (ref 0.0–0.5)
Bilirubin Indirect: 0.3 mg/dL (ref 0.0–1.0)

## 2013-11-15 LAB — GFR: EGFR: >60

## 2013-11-15 LAB — LACTATE DEHYDROGENASE: LDH: 180 U/L (ref 125–220)

## 2013-11-15 LAB — TYPE AND SCREEN
AB Screen Gel: NEGATIVE
ABO Rh: B POS

## 2013-11-15 LAB — URIC ACID: Uric acid: 5.7 mg/dL (ref 2.6–6.0)

## 2013-11-15 LAB — HEMOLYSIS INDEX: Hemolysis Index: 2 (ref 0–18)

## 2013-11-15 SURGERY — Surgical Case
Anesthesia: Spinal

## 2013-11-15 NOTE — Progress Notes (Signed)
Discharge instructions given to pt. No questions at this time. Pt verbalizes understanding of all discharge information

## 2013-11-15 NOTE — Progress Notes (Signed)
Dr. Katheren Shams notified of all lab results. Orders given to discharge pt home. Pt has an appointment today at the office at 1430. Pt is to go to the office from the hospital.

## 2013-11-15 NOTE — Discharge Instructions (Signed)
Call your doctor if:  Contractions become stronger and regular every 5-10 minutes for one hour.  Bag of water ruptures. (Sudden gush of fluid or continuous leak)  Vaginal bleeding occurs (bright red, running down legs or passed blood clots).    Spotting after a vaginal exam or mucous bloody show is normal.  You experience any unusually sharp abdominal pain.  You notice a decrease in fetal movement.  If you notice a decrease in the fetal movement.  Get something to eat and drink, lay on your side, place you had on your abdomen and perform your fetal kick counts.  Have this information available for your MD.  You have persistent severe headaches.  You have blurred vision or spots before the eyes.  You have persistent nausea and vomiting.  You have sudden increase swelling of hands, feet, face, and ankles (if suddenly your rings are too tight).  You have chills and a fever equalled to or greater than 100.4 (not accompanied by a cold).  You have very frequent urination or burning with urination.  You have further concerns or questions.       Kick Counts  It's normal to worry about your baby's health. One way you can know your baby's doing well is to record the baby's movements once a day. This is called a kick count. You will usually feel your baby move by the 20th week of pregnancy. Remember to take your kick count records to all your appointments with your healthcare provider.   How to Count Kicks  Choose a time when the baby is active, such as after a meal.   Sit comfortably or lie on your side and place your hand on your abdomen.   The first time the baby moves, write down the time.   Count each movement until the baby has moved 10 times. This can take from 20 minutes to 2 hours.   If the baby hasn't moved 4 times in 1 hour, pat your stomach to wake the baby up.  Write down the time you feel the baby's 10th movement.  Try to do it at the same time each day.  When to Call Your Healthcare Provider  Call your  healthcare provider right away if you notice any of the following:  Your baby moves fewer than 10 times in 4 hours while you're doing kick counts.  You have not felt your baby move all day.   2000-2011 [employer], [address], [place] [address]. All rights reserved. This information is not intended as a substitute for professional medical care. Always follow your healthcare professional's instructions.

## 2013-11-15 NOTE — H&P (Signed)
9/8/20157:45 AM    [name] West is a 23 y.o. G1P0 [redacted]w[redacted]d weeks gestation with Estimated Date of Delivery: 11/26/13 who presented for observation & laboratory  Work up for preeclampsia secondary to transient hypertension of pregnancy. She denies bleeding, leaking of fluid, decrease fetal movement.  Patient also denies headache, abdominal pain or visual changes    Prenatal complications: Transient hypertension of prenancy undelivered    V/S: BP 112/66 -136/77   P: 82-88   T: 97.8       FHR: Baseline Rate: 130 BPM, Variability: Moderate, Pattern: Accelerations, FHR Category: Category I  Ctx: Contraction Frequency: 0    Cervix:       Membrane Status: Intact        Recent Labs  Lab 11/11/13  1438   WBC 11.28*   HEMOGLOBIN 10.8*   HEMATOCRIT 32.6*   PLATELETS 236       Recent Labs  Lab 11/11/13  1438   SODIUM 140   POTASSIUM 4.1   CHLORIDE 111   CO2 20*   BUN 10.0   CREATININE 0.7   CALCIUM 9.2   ALBUMIN 2.7*   PROTEIN, TOTAL 6.3   BILIRUBIN, TOTAL 0.4   ALKALINE PHOSPHATASE 140*   ALT 8   AST (SGOT) 20   GLUCOSE 69*       Indication: Evaluate AFI.  TECHNIQUE: Transabdominal imaging.  CLINICAL DATA: Uncertain LMP. Age by prior sonogram 37.6 weeks. [facility]  11/26/2013.  FINDINGS: Live intrauterine gestation. Breech presentation. Fundal  placenta without previa. Grade 2/3 placental change. Normal AFI of 14  cm. Protein in the amniotic fluid.  Cervix 3.7 cm and closed. Maternal adnexa obscured by the ongoing  gestation.  Dates:  BPD 9.2 cm 37.2 weeks.  At circumference 31.9 cm 36 weeks.  Abdominal circumference 33.3 cm 37.2 weeks.  Femur length 7.6 cm 39 weeks.  Head abdomen ratio 0.96-normal.  Estimated fetal weight 3216 g at the 51st percentile.  The heart rate is 121 bpm.  IMPRESSION:   Live intrauterine gestation of 37.3 weeks. Appropriate  growth. Normal AFI. Increased punctate echogenic material in the  amniotic fluid consistent with protein.  Kinnie Feil, MD   11/11/2013 9:47 PM          Assessment:  [redacted]w[redacted]d weeks  gestation  Transient hypertension of pregnancy undelivered  Normal laboratory panel  Maternal fetal status reassuring    Plan:  Discharge undelivered   Call if decrease fetal movement, leaking of fluid, decrease fetal movement, any questions or concerns  Follow up in office for antenatal care.    Lorenza Evangelist

## 2013-11-16 NOTE — H&P (Signed)
Triage Note    9/9/20152:10 AM         [name] West is a 23 y.o. G1P0 [redacted]w[redacted]d weeks gestation with Estimated Date of Delivery: 11/26/13 who presented for fetal non stress test   Secondary to Transient hypertension of pregnancy. She denies headache, visual changes, leaking of fluid or decrease fetal movement.    Prenatal complications: Transient hypertension of pregnancy         Breech presentation    History reviewed. No pertinent past medical history.  Past Surgical History   Procedure Laterality Date   . Tonsilectomy, adenoidectomy, bilateral myringotomy and tubes       No Known Allergies    Temp:  [98.1 F (36.7 C)] 98.1 F (36.7 C)  Heart Rate:  [65-82] 82  Resp Rate:  [16] 16  BP: (0-167)/(0-103) 118/73 mmHg    FHR: Baseline Rate: 135 BPM, Variability: Moderate, Pattern: Accelerations, FHR Category: Category I    Ctx: Contraction Frequency: irregular    Cervix:       Membrane Status: Intact        Recent Labs  Lab 11/15/13  1103   WBC 10.84*   HEMOGLOBIN 11.2*   HEMATOCRIT 33.6*   PLATELETS 260       Recent Labs  Lab 11/15/13  1103   SODIUM 137   POTASSIUM 3.8   CHLORIDE 106   CO2 21*   BUN 10.0   CREATININE 0.8   CALCIUM 10.4   ALBUMIN 2.7*   PROTEIN, TOTAL 6.2   BILIRUBIN, TOTAL 0.4   ALKALINE PHOSPHATASE 146*   ALT 9   AST (SGOT) 17   GLUCOSE 63*         Maternal Prenatal Labs:     Lab Results   Component Value Date    ABORH B POS 11/15/2013    HCT 33.6* 11/15/2013    HGB 11.2* 11/15/2013    PLT 260 11/15/2013       Assessment:  [redacted]w[redacted]d weeks gestation  Maternal fetal status reassuring & reactive  No clinical or laboratory signs of preecalmpsia    Plan:  Discharge undelivered  Breech presentation: Schedule for ECV  Call if severe pain, bleeding, decrese fetal movement, leaking of fluid, any questions or concerns.  Follow up with in the office for antenatal care        Norman Herrlich, MD  11/16/2013 2:10 AM

## 2013-11-21 ENCOUNTER — Observation Stay: Payer: Medicaid Other | Admitting: Certified Registered"

## 2013-11-21 ENCOUNTER — Inpatient Hospital Stay: Payer: Medicaid Other | Admitting: Obstetrics and Gynecology

## 2013-11-21 ENCOUNTER — Inpatient Hospital Stay
Admission: AD | Admit: 2013-11-21 | Discharge: 2013-11-24 | DRG: 651 | Disposition: A | Discharge status: Home / Self Care | Payer: Medicaid Other | Source: Ambulatory Visit | Attending: Obstetrics and Gynecology | Admitting: Obstetrics and Gynecology

## 2013-11-21 ENCOUNTER — Encounter
Admission: AD | Disposition: A | Discharge status: Home / Self Care | Payer: Self-pay | Source: Ambulatory Visit | Attending: Obstetrics and Gynecology

## 2013-11-21 DIAGNOSIS — O321XX0 Maternal care for breech presentation, not applicable or unspecified: Secondary | ICD-10-CM | POA: Diagnosis present

## 2013-11-21 DIAGNOSIS — Z23 Encounter for immunization: Secondary | ICD-10-CM

## 2013-11-21 DIAGNOSIS — O139 Gestational [pregnancy-induced] hypertension without significant proteinuria, unspecified trimester: Principal | ICD-10-CM | POA: Diagnosis present

## 2013-11-21 DIAGNOSIS — Q5128 Other doubling of uterus, other specified: Secondary | ICD-10-CM

## 2013-11-21 DIAGNOSIS — O3400 Maternal care for unspecified congenital malformation of uterus, unspecified trimester: Secondary | ICD-10-CM

## 2013-11-21 LAB — CBC AND DIFFERENTIAL
Basophils Absolute Automated: 0.01 10*3/uL (ref 0.00–0.20)
Basophils Automated: 0 %
Eosinophils Absolute Automated: 0.09 10*3/uL (ref 0.00–0.70)
Eosinophils Automated: 1 %
Hematocrit: 32.5 % — ABNORMAL LOW (ref 37.0–47.0)
Hgb: 11.1 g/dL — ABNORMAL LOW (ref 12.0–16.0)
Immature Granulocytes Absolute: 0.04 10*3/uL
Immature Granulocytes: 0 %
Lymphocytes Absolute Automated: 2.51 10*3/uL (ref 0.50–4.40)
Lymphocytes Automated: 21 %
MCH: 28.1 pg (ref 28.0–32.0)
MCHC: 34.2 g/dL (ref 32.0–36.0)
MCV: 82.3 fL (ref 80.0–100.0)
MPV: 11 fL (ref 9.4–12.3)
Monocytes Absolute Automated: 0.82 10*3/uL (ref 0.00–1.20)
Monocytes: 7 %
Neutrophils Absolute: 8.28 10*3/uL — ABNORMAL HIGH (ref 1.80–8.10)
Neutrophils: 71 %
Nucleated RBC: 0 /100 WBC (ref 0–1)
Platelets: 295 10*3/uL (ref 140–400)
RBC: 3.95 10*6/uL — ABNORMAL LOW (ref 4.20–5.40)
RDW: 14 % (ref 12–15)
WBC: 11.71 10*3/uL — ABNORMAL HIGH (ref 3.50–10.80)

## 2013-11-21 LAB — TYPE AND SCREEN
AB Screen Gel: NEGATIVE
ABO Rh: B POS

## 2013-11-21 LAB — COMPREHENSIVE METABOLIC PANEL
Anion Gap: 11 (ref 5.0–15.0)
BUN: 10 mg/dL (ref 7.0–19.0)
CO2: 21 mEq/L — ABNORMAL LOW (ref 22–29)
Calcium: 9.2 mg/dL (ref 8.5–10.5)
Chloride: 106 mEq/L (ref 100–111)
Creatinine: 1 mg/dL (ref 0.6–1.0)
Glucose: 86 mg/dL (ref 70–100)
Potassium: 4.4 mEq/L (ref 3.5–5.1)
Sodium: 138 mEq/L (ref 136–145)

## 2013-11-21 LAB — HEPATIC FUNCTION PANEL
ALT: 10 U/L (ref 0–55)
AST (SGOT): 22 U/L (ref 5–34)
Albumin/Globulin Ratio: 0.9 (ref 0.9–2.2)
Albumin: 3 g/dL — ABNORMAL LOW (ref 3.5–5.0)
Alkaline Phosphatase: 160 U/L — ABNORMAL HIGH (ref 37–106)
Bilirubin Direct: 0.1 mg/dL (ref 0.0–0.5)
Bilirubin Indirect: 0.2 mg/dL (ref 0.0–1.0)
Bilirubin, Total: 0.3 mg/dL (ref 0.2–1.2)
Globulin: 3.4 g/dL (ref 2.0–3.6)
Protein, Total: 6.4 g/dL (ref 6.0–8.3)

## 2013-11-21 LAB — GFR: EGFR: >60

## 2013-11-21 LAB — HEMOLYSIS INDEX: Hemolysis Index: 10 (ref 0–18)

## 2013-11-21 SURGERY — Surgical Case
Anesthesia: Regional | Wound class: Clean Contaminated

## 2013-11-21 MED ORDER — MIDAZOLAM HCL 2 MG/2ML IJ SOLN
INTRAMUSCULAR | Status: DC | PRN
Start: 2013-11-21 — End: 2013-11-21
  Administered 2013-11-21: 2 mg via INTRAVENOUS

## 2013-11-21 MED ORDER — PROMETHAZINE HCL 25 MG/ML IJ SOLN
6.2500 mg | Freq: Four times a day (QID) | INTRAMUSCULAR | Status: DC | PRN
Start: 2013-11-21 — End: 2013-11-24

## 2013-11-21 MED ORDER — OXYTOCIN 30UNITS IN LR 500 ML PP--OUTSOURCED
7.5000 [IU]/h | INTRAVENOUS | Status: DC
Start: 2013-11-21 — End: 2013-11-21
  Administered 2013-11-21: 7.5 [IU]/h via INTRAVENOUS

## 2013-11-21 MED ORDER — LANOLIN EX OINT
TOPICAL_OINTMENT | CUTANEOUS | Status: DC | PRN
Start: 2013-11-21 — End: 2013-11-24

## 2013-11-21 MED ORDER — NALBUPHINE HCL 10 MG/ML IJ SOLN
10.0000 mg | INTRAMUSCULAR | Status: DC | PRN
Start: 2013-11-21 — End: 2013-11-24
  Administered 2013-11-21 – 2013-11-22 (×2): 10 mg via INTRAVENOUS
  Filled 2013-11-21: qty 1

## 2013-11-21 MED ORDER — BISACODYL 10 MG RE SUPP
10.0000 mg | Freq: Every day | RECTAL | Status: DC | PRN
Start: 2013-11-21 — End: 2013-11-24

## 2013-11-21 MED ORDER — OXYTOCIN 30 UNITS IN LR 500 ML (BOLUS)
INTRAVENOUS | Status: DC | PRN
Start: 2013-11-21 — End: 2013-11-21
  Administered 2013-11-21: 30 [IU] via INTRAVENOUS

## 2013-11-21 MED ORDER — CEFAZOLIN SODIUM-DEXTROSE 2-3 GM-% IV SOLR
2.0000 g | Freq: Three times a day (TID) | INTRAVENOUS | Status: DC
Start: 2013-11-21 — End: 2013-11-21
  Administered 2013-11-21: 2 g via INTRAVENOUS
  Filled 2013-11-21: qty 50

## 2013-11-21 MED ORDER — LACTATED RINGERS IV SOLN
INTRAVENOUS | Status: DC
Start: 2013-11-21 — End: 2013-11-22

## 2013-11-21 MED ORDER — OXYCODONE-ACETAMINOPHEN 5-325 MG PO TABS
1.0000 | ORAL_TABLET | Freq: Once | ORAL | Status: AC | PRN
Start: 2013-11-21 — End: 2013-11-22
  Administered 2013-11-22: 1 via ORAL
  Filled 2013-11-21: qty 1

## 2013-11-21 MED ORDER — SIMETHICONE 80 MG PO CHEW
160.0000 mg | CHEWABLE_TABLET | Freq: Three times a day (TID) | ORAL | Status: DC | PRN
Start: 2013-11-21 — End: 2013-11-24
  Administered 2013-11-22: 160 mg via ORAL
  Filled 2013-11-21 (×2): qty 2

## 2013-11-21 MED ORDER — PROPOFOL 10 MG/ML IV EMUL
INTRAVENOUS | Status: AC
Start: 2013-11-21 — End: ?
  Filled 2013-11-21: qty 20

## 2013-11-21 MED ORDER — OXYCODONE-ACETAMINOPHEN 5-325 MG PO TABS
2.0000 | ORAL_TABLET | ORAL | Status: DC | PRN
Start: 2013-11-21 — End: 2013-11-24
  Administered 2013-11-21 – 2013-11-24 (×8): 2 via ORAL
  Filled 2013-11-21 (×7): qty 2
  Filled 2013-11-21: qty 1
  Filled 2013-11-21 (×6): qty 2

## 2013-11-21 MED ORDER — MORPHINE SULFATE (PF) 1 MG/ML IJ SOLN
INTRAMUSCULAR | Status: DC | PRN
Start: 2013-11-21 — End: 2013-11-21
  Administered 2013-11-21: 4.8 mg via INTRAVENOUS
  Administered 2013-11-21: 5 mg via INTRAVENOUS

## 2013-11-21 MED ORDER — DIPHENHYDRAMINE HCL 50 MG/ML IJ SOLN
6.2500 mg | INTRAMUSCULAR | Status: DC | PRN
Start: 2013-11-21 — End: 2013-11-24

## 2013-11-21 MED ORDER — EPHEDRINE SULFATE 50 MG/ML IJ SOLN
INTRAMUSCULAR | Status: DC | PRN
Start: 2013-11-21 — End: 2013-11-21
  Administered 2013-11-21: 10 mg via INTRAVENOUS

## 2013-11-21 MED ORDER — FENTANYL CITRATE 0.05 MG/ML IJ SOLN
25.0000 ug | INTRAMUSCULAR | Status: DC | PRN
Start: 2013-11-21 — End: 2013-11-21

## 2013-11-21 MED ORDER — CEFAZOLIN SODIUM-DEXTROSE 2-3 GM-% IV SOLR
2.0000 g | Freq: Three times a day (TID) | INTRAVENOUS | Status: DC
Start: 2013-11-21 — End: 2013-11-21

## 2013-11-21 MED ORDER — SODIUM CHLORIDE 0.9 % IV SOLN
0.4000 mg/h | INTRAVENOUS | Status: DC
Start: 2013-11-21 — End: 2013-11-22

## 2013-11-21 MED ORDER — FERROUS SULFATE 324 (65 FE) MG PO TBEC
324.0000 mg | DELAYED_RELEASE_TABLET | Freq: Two times a day (BID) | ORAL | Status: DC
Start: 2013-11-21 — End: 2013-11-24
  Administered 2013-11-21 – 2013-11-24 (×6): 324 mg via ORAL
  Filled 2013-11-21 (×7): qty 1

## 2013-11-21 MED ORDER — PRENATAL AD PO TABS
1.0000 | ORAL_TABLET | Freq: Every day | ORAL | Status: DC
Start: 2013-11-21 — End: 2013-11-24
  Administered 2013-11-21 – 2013-11-24 (×3): 1 via ORAL
  Filled 2013-11-21 (×4): qty 1

## 2013-11-21 MED ORDER — OXYTOCIN 30UNITS IN LR 500 ML PP--OUTSOURCED
INTRAVENOUS | Status: AC
Start: 2013-11-21 — End: 2013-11-21
  Filled 2013-11-21: qty 1000

## 2013-11-21 MED ORDER — SODIUM CHLORIDE 0.9 % IJ SOLN
INTRAMUSCULAR | Status: AC
Start: 2013-11-21 — End: ?
  Filled 2013-11-21: qty 10

## 2013-11-21 MED ORDER — IBUPROFEN 600 MG PO TABS
600.0000 mg | ORAL_TABLET | Freq: Four times a day (QID) | ORAL | Status: DC | PRN
Start: 2013-11-21 — End: 2013-11-24
  Filled 2013-11-21 (×4): qty 1

## 2013-11-21 MED ORDER — PRENATAL AD PO TABS
1.0000 | ORAL_TABLET | Freq: Every day | ORAL | Status: DC
Start: 2013-11-21 — End: 2013-11-21

## 2013-11-21 MED ORDER — NALBUPHINE HCL 10 MG/ML IJ SOLN
10.0000 mg | Freq: Four times a day (QID) | INTRAMUSCULAR | Status: DC | PRN
Start: 2013-11-21 — End: 2013-11-22
  Administered 2013-11-22: 10 mg via INTRAMUSCULAR
  Filled 2013-11-21 (×2): qty 1

## 2013-11-21 MED ORDER — MIDAZOLAM HCL 2 MG/2ML IJ SOLN
INTRAMUSCULAR | Status: AC
Start: 2013-11-21 — End: ?
  Filled 2013-11-21: qty 2

## 2013-11-21 MED ORDER — EPHEDRINE SULFATE 50 MG/ML IJ SOLN
INTRAMUSCULAR | Status: AC
Start: 2013-11-21 — End: ?
  Filled 2013-11-21: qty 1

## 2013-11-21 MED ORDER — SODIUM CHLORIDE 0.9 % IJ SOLN
3.0000 mL | Freq: Three times a day (TID) | INTRAMUSCULAR | Status: DC
Start: 2013-11-22 — End: 2013-11-24

## 2013-11-21 MED ORDER — OXYCODONE-ACETAMINOPHEN 5-325 MG PO TABS
2.0000 | ORAL_TABLET | ORAL | Status: DC | PRN
Start: 2013-11-21 — End: 2013-11-24
  Administered 2013-11-21 – 2013-11-23 (×6): 2 via ORAL
  Administered 2013-11-24 (×2): 1 via ORAL
  Filled 2013-11-21 (×3): qty 2

## 2013-11-21 MED ORDER — BUPIVACAINE IN DEXTROSE 0.75-8.25 % IT SOLN
INTRATHECAL | Status: DC | PRN
Start: 2013-11-21 — End: 2013-11-21
  Administered 2013-11-21: 2.2 mL via INTRASPINAL

## 2013-11-21 MED ORDER — ONDANSETRON HCL 4 MG/2ML IJ SOLN
4.0000 mg | Freq: Three times a day (TID) | INTRAMUSCULAR | Status: DC | PRN
Start: 2013-11-21 — End: 2013-11-22

## 2013-11-21 MED ORDER — OXYTOCIN 30UNITS IN LR 500 ML PP--OUTSOURCED
7.5000 [IU]/h | INTRAVENOUS | Status: AC
Start: 2013-11-21 — End: 2013-11-21

## 2013-11-21 MED ORDER — LACTATED RINGERS IV SOLN
125.0000 mL/h | INTRAVENOUS | Status: AC
Start: 2013-11-21 — End: 2013-11-22

## 2013-11-21 MED ORDER — ONDANSETRON HCL 4 MG/2ML IJ SOLN
INTRAMUSCULAR | Status: AC
Start: 2013-11-21 — End: ?
  Filled 2013-11-21: qty 2

## 2013-11-21 MED ORDER — LACTATED RINGERS IV SOLN
INTRAVENOUS | Status: DC
Start: 2013-11-21 — End: 2013-11-24

## 2013-11-21 MED ORDER — HYDROMORPHONE HCL PF 1 MG/ML IJ SOLN
0.5000 mg | INTRAMUSCULAR | Status: DC | PRN
Start: 2013-11-21 — End: 2013-11-21

## 2013-11-21 MED ORDER — MEASLES, MUMPS & RUBELLA VAC SC INJ
0.5000 mL | INJECTION | SUBCUTANEOUS | Status: DC | PRN
Start: 2013-11-21 — End: 2013-11-24

## 2013-11-21 MED ORDER — TETANUS-DIPHTH-ACELL PERTUSSIS 5-2.5-18.5 LF-MCG/0.5 IM SUSP
0.5000 mL | INTRAMUSCULAR | Status: AC | PRN
Start: 2013-11-21 — End: 2013-11-24
  Administered 2013-11-24: 0.5 mL via INTRAMUSCULAR
  Filled 2013-11-21: qty 0.5

## 2013-11-21 MED ORDER — MORPHINE SULFATE (PF) 1 MG/ML IJ SOLN
INTRAMUSCULAR | Status: AC
Start: 2013-11-21 — End: ?
  Filled 2013-11-21: qty 10

## 2013-11-21 MED ORDER — IBUPROFEN 600 MG PO TABS
600.0000 mg | ORAL_TABLET | Freq: Four times a day (QID) | ORAL | Status: DC
Start: 2013-11-21 — End: 2013-11-24
  Administered 2013-11-21 – 2013-11-24 (×12): 600 mg via ORAL
  Filled 2013-11-21 (×8): qty 1

## 2013-11-21 MED ORDER — ONDANSETRON HCL 4 MG/2ML IJ SOLN
INTRAMUSCULAR | Status: DC | PRN
Start: 2013-11-21 — End: 2013-11-21
  Administered 2013-11-21: 4 mg via INTRAVENOUS

## 2013-11-21 MED ORDER — PROPOFOL INFUSION 10 MG/ML
INTRAVENOUS | Status: DC | PRN
Start: 2013-11-21 — End: 2013-11-21
  Administered 2013-11-21: 30 mg via INTRAVENOUS
  Administered 2013-11-21: 20 mg via INTRAVENOUS

## 2013-11-21 MED ORDER — NALOXONE HCL 0.4 MG/ML IJ SOLN
0.1000 mg | INTRAMUSCULAR | Status: DC | PRN
Start: 2013-11-21 — End: 2013-11-22

## 2013-11-21 MED ORDER — LACTATED RINGERS IV BOLUS
1000.0000 mL | Freq: Once | INTRAVENOUS | Status: DC
Start: 2013-11-21 — End: 2013-11-21

## 2013-11-21 MED ORDER — SENNOSIDES-DOCUSATE SODIUM 8.6-50 MG PO TABS
1.0000 | ORAL_TABLET | Freq: Every evening | ORAL | Status: DC | PRN
Start: 2013-11-21 — End: 2013-11-24

## 2013-11-21 MED ORDER — IBUPROFEN 600 MG PO TABS
600.0000 mg | ORAL_TABLET | Freq: Once | ORAL | Status: AC | PRN
Start: 2013-11-21 — End: 2013-11-21
  Administered 2013-11-21: 600 mg via ORAL
  Filled 2013-11-21: qty 1

## 2013-11-21 SURGICAL SUPPLY — 35 items
BASIN L (Procedure Accessories) ×2
BLADE BOVIE 2.75 HEX COAT (Blade) ×2 IMPLANT
DRESSING TELFA 3X8IN STERILE (Dressing) ×2 IMPLANT
GLOVE SRG 8 BGL SNSR LTX STRL PF TXTR (Glove) ×1
GLOVE SRG NTR RBR 8 INDCTR BGL 299X103MM (Glove) ×1
GLOVE SURGICAL 8 BIOGEL SENSOR POWDER (Glove) ×1
GLOVE SURGICAL 8 BIOGEL SENSOR POWDER FREE TEXTURE BEAD CUFF STRAIGHT (Glove) ×1 IMPLANT
GLOVE SURGICAL 8 INDICATOR BIOGEL POWDER (Glove) ×1
GLOVE SURGICAL 8 INDICATOR BIOGEL POWDER FREE SMOOTH BEAD CUFF (Glove) ×1 IMPLANT
GOWN SRG PRFRM FBRC LG STDLN AERO BLU LF (Gown) ×1
GOWN SURGICAL LARGE STANDARD LENGTH (Gown) ×1
GOWN SURGICAL LARGE STANDARD LENGTH PERFORMANCE FABRIC AERO BLUE GREEN (Gown) ×1 IMPLANT
OCTYSEAL .7G BOX (Ortho Supply) IMPLANT
PAD ABD PVC CRTY 9X5IN LF STRL 3 LYR (Dressing) IMPLANT
PAD ELECTROSRG GRND REM W CRD (Procedure Accessories) ×2 IMPLANT
PAD PREP CUFF 24X41IN W 9IN (Prep) ×2 IMPLANT
SET BASIN WASH LABOR AND DELIVERY ALEX10479A (Procedure Accessories) ×1 IMPLANT
SHEET LAPAROTMY TRNSVRS 72X119 (Drape) ×2 IMPLANT
SLEEVE SEQUEN COMP KNEE REG (Procedure Accessories) ×2 IMPLANT
SOLUTION SRGPRP 74% ISPRP 0.7% IOD (Prep) ×1
SOLUTION SURGICAL PREP 26 ML DURAPREP (Prep) ×1
SOLUTION SURGICAL PREP 26 ML DURAPREP 74% ISOPROPYL ALCOHOL 0.7% (Prep) ×1 IMPLANT
SPONGE LAP 18X18IN PREWASH WHT (Sponge)
SPONGE LAPAROTOMY L18 IN X W18 IN (Sponge)
SPONGE LAPAROTOMY L18 IN X W18 IN PREWASH WHITE (Sponge) IMPLANT
STAPLER SKIN L3.9 MM X W6.9 MM WIDE 35 (Skin Closure)
STAPLER SKIN L3.9 MM X W6.9 MM WIDE 35 COUNT FIX HEAD RATCHET (Skin Closure) IMPLANT
STAPLER SKN SS W PRX PX .58MM 3.9X6.9MM (Skin Closure)
SUTURE MONOCRYL 0 CT1 36IN (Suture) ×4 IMPLANT
SUTURE MONOCRYL 2-0 CT1 36IN (Suture) ×8 IMPLANT
SUTURE MONOCRYL 4-0 PS2 27IN (Suture) IMPLANT
SUTURE VICRYL 0 CT1 36IN (Suture) ×4 IMPLANT
TRAY C-SECTION PACK (Pack) ×2 IMPLANT
TRAY FOLEY URINE METER IC16 F (Catheter Micellaneous) ×1
TRAY FOLEY URINE METER IC16 F (Catheter Miscellaneous) ×1 IMPLANT

## 2013-11-21 NOTE — Plan of Care (Signed)
Problem: Safety  Goal: Patient will be free from injury during hospitalization  Outcome: Progressing  Pt stable at this time, no complaints of headache, visual disturbances or dizziness. Encouraged breastfeeding and hand expression, instructed to call if needing assistance. POC discussed, questions answered and educated about Baby Friendly and safety precautions. Will continue to monitor.

## 2013-11-21 NOTE — Op Note (Signed)
Cesarean Section Procedure Note    Indications: Spontaneous rupture of membranes             Breech Presentation    Pre-operative Diagnosis: [redacted]w[redacted]d    Post-operative Diagnosis:  1. Spontaneous rupture of membranes      2. Homero Fellers Breech Presentation      3. 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 Mild partial uterine septum(about 0.5 -1 cm)    Surgeon: Norman Herrlich     Assistants: Hibshman. K MD    Anesthesia: Spinal      Procedure Details   The risks, benefits, complications, treatment options, and expected outcomes were discussed with the patient.  The patient concurred with the proposed plan, giving informed consent.  The patient was taken to Operating Room with IV running, identified as [place] and the procedure verified as C-Section Delivery. A Time Out was held and the above information confirmed.    She was placed in the dorsal supine position with leftward tilt.  After anesthesia was noted to be adequate, the patient was draped and prepped in the usual sterile manner. A Pfannenstiel incision was made  carried down through the subcutaneous tissue to the fascia with knife&bovie cautery.  Fascial incision was made and extended transversely. The rectus muscles were transected off the fascia, identified in the midline and separated.  The peritoneum was identified and entered. Peritoneal incision was extended superiorly and inferiorly with good visualization of the bladder.  The utero-vesical peritoneal was identified, grasped with pick up, entered sharply with metzenbaum scissors and extended laterally.  Bladder flap was then created digitally and with sharp dissection.The lower uterine segment was then identified and a low transverse uterine incision was made and extended laterally, bluntly.  Amniotomy of clear fluid was noted.  The head, shoulders and body were then delivered atraumatically.  Delivered from breech presentation  After the umbilical cord was clamped and cut the infant was  handed off to awaiting pediatrician staff. The placenta was allowed to deliver spontanously and appeared intact and  normal. The uterine outline, tubes and ovaries appeared normal. The uterine incision was then closed with running  sutures of 0 Monocryl. Hemostasis was observed. The gutters were cleared of all clot/debris.  Instruments and laps were removed from the abdomen.  The visceral peritoneum was re-approximated with 2-0 Monocryl.  The Muscle were approximated using 0 Monocryl. The fascia was then re approximated using 0 Vicryl. The subcutaneous space was closed with 2-0 Monocryl. The Skin was closed with 4-0 Vicryl on a Keith needle in a subcuticular fashion. Adhesive solution was applied to wound incision  Instrument, sponge, and needle counts were correct x2.   patient received IV antibiotics prior to skin incision..     Findings:  Normal appearing tubes and ovaries.  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  Condition: stable      Norman Herrlich, MD  11/21/2013  4:41 AM

## 2013-11-21 NOTE — Plan of Care (Signed)
Problem: Parenting/Coping- Postpartum  Goal: Positive mother-baby interactions are observed.  Outcome: Progressing  Fundus is firm, small lochia , pt is encouraged to drink plenty , pt stated her pain is worse with movemnet , but managed with pain med, pt is aware she can have pain med every 4-6 hrs as needed, will continue to monitor.

## 2013-11-21 NOTE — H&P (Signed)
11/21/2013 1:05 AM     [name] West is a 23 y.o. year old G 1P0 @ 39.2 weeks, breech presentation, grossly ruptured her membranes. She denies pain, bleeding, or decreased fetal movement.    History     Problems with present pregnancy: Gestational hypertension               Breech presentation               GBS positive    OB History:   OB History     Gravida Para Term Preterm AB TAB SAB Ectopic Multiple Living    1                   Allergies: Review of patient's allergies indicates no known allergies.    Current medications: No current facility-administered medications for this encounter.    Gestational age: [redacted]w[redacted]d    Previous Pregnancy Problems: None    Other significant prenatal, medical, or family history:  No past medical history on file.  Past Surgical History   Procedure Laterality Date   . Tonsilectomy, adenoidectomy, bilateral myringotomy and tubes         Prenatal labs:  Lab Results   Component Value Date    ABORH B POS 11/15/2013       Physician/CNM admission/Physical Examination     Vital Signs:   Filed Vitals:    11/21/13 0058   Temp: 99.2 F (37.3 C)     WNU:UVOZDGUY I, Reactive    Ctx: Occasional    General Appearance: Alert, appropriate appearance for age. No acute distress,  HEENT Exam: Grossly normal  Neck / Thyroid Exam: Supple, no masses, nodes or enlargement.,  Chest/Respiratory Exam: Normal chest wall and respirations. Clear to auscultation., Breast Exam: No dimpling, nipple retraction or discharge. No masses or nodes., Cardiovascular Exam: Regular rate and rhythm. S1, S2, no murmur, click, gallop, or rubs.,   Abdominal Exam: Gravid, breech    Pelvic Exam Female: External genitalia: normal general appearance, Cervix: close  Lymphatic Exam: Non-palpable nodes in neck, clavicular, axillary, or inguinal regions, Musculoskeletal Exam: Back is straight and non-tender, full ROM of upper and lower extremities.,   Skin: no rash or abnormalities  Neurologic Exam: Normal gait and speech, no tremor. and    Psychiatric Exam: Alert and oriented, appropriate affect.    Impression: Term, @ 39.2 weeks SROM   Transient hypertension in pregnancy  Breech presentation  Maternal fetal status reassuring & reactive    Proposed Plan of Care     Admit for cesarean section  Please see standard orders        Norman Herrlich, MD   11/21/2013 1:05 AM

## 2013-11-21 NOTE — Anesthesia Procedure Notes (Signed)
Spinal    Patient location during procedure: L&D  Reason for block: labor  Block at Surgeon's request: Yes    Start time: 11/21/2013 3:03 AM    End time: 11/21/2013 3:14 AM    Staffing  Anesthesiologist: Sandrea Matte  Resident/CRNA: WEAVER, Denny Peon B  Performed by: anesthesiologist   Preanesthetic Checklist Completed: patient identified, surgical consent, pre-op evaluation, timeout performed, risks and benefits discussed, monitors and equipment checked, anesthesia consent given and correct site  Timeout Completed:  11/21/2013 3:06 AM    Spinal  Patient monitoring: pulse oximetry, EKG, NIBP and nasal cannula O2  Premedication: No    Patient position: sitting  Sterile Technique: sterile drape, DuraPrep, mask  and sterile gloves  Skin Local: lidocaine 1%    Interspace: L3-4    Approach: midline  Number of attempts: 1    Needle type: Whitacre tip     Needle gauge: 27              Paresthesia Pain: no        CSF Return: Yes  Blood Return: No          Assessment   Sensory level: T4  Block Outcome: patient tolerated procedure well and no complications  Additional Notes  Easily placed first attempt

## 2013-11-21 NOTE — Transfer of Care (Signed)
Anesthesia Transfer of Care Note    Patient: [name] West    Procedures performed: Procedure(s):  CESAREAN SECTION    Anesthesia type: Spinal    Patient location:[facility] PACU    Last vitals:   Filed Vitals:    11/21/13 0420   BP: 135/75   Pulse: 95   Temp: 36.8 C (98.3 F)   Resp: 18   SpO2: 99%       Post pain: Patient not complaining of pain, continue current therapy      Mental Status:awake    Respiratory Function: tolerating room air    Cardiovascular: stable    Nausea/Vomiting: patient not complaining of nausea or vomiting    Hydration Status: adequate    Post assessment: no apparent anesthetic complications

## 2013-11-21 NOTE — Progress Notes (Signed)
11/21/13 1300   Lactation Consultation   Visit Type  Initial Assessment   Maternal Information   Breastfeeding Status YES   Previous Breastfeeding Experience No   In room visit. Mom is a P1 with a new baby girl named "[name] West." Mom stated that baby has been sleepy. Encouraged Mom to hold baby S2S as often as possible as this helps to establish breastfeeding.Mom stated that baby has latched several times since birth. Encouraged Mom to call for assitance as needed and invited Mom to the breastfeeding Class.

## 2013-11-22 ENCOUNTER — Encounter: Payer: Self-pay | Admitting: Obstetrics and Gynecology

## 2013-11-22 LAB — CBC AND DIFFERENTIAL
Absolute NRBC: 0 10*3/uL
Basophils Absolute Automated: 0.03 10*3/uL (ref 0.00–0.20)
Basophils Automated: 0 %
Eosinophils Absolute Automated: 0.2 10*3/uL (ref 0.00–0.70)
Eosinophils Automated: 2 %
Hematocrit: 30.4 % — ABNORMAL LOW (ref 37.0–47.0)
Hgb: 10 g/dL — ABNORMAL LOW (ref 12.0–16.0)
Immature Granulocytes Absolute: 0.06 10*3/uL — ABNORMAL HIGH
Immature Granulocytes: 0 %
Lymphocytes Absolute Automated: 2.73 10*3/uL (ref 0.50–4.40)
Lymphocytes Automated: 20 %
MCH: 27.6 pg — ABNORMAL LOW (ref 28.0–32.0)
MCHC: 32.9 g/dL (ref 32.0–36.0)
MCV: 84 fL (ref 80.0–100.0)
MPV: 11.1 fL (ref 9.4–12.3)
Monocytes Absolute Automated: 1.05 10*3/uL (ref 0.00–1.20)
Monocytes: 8 %
Neutrophils Absolute: 9.57 10*3/uL — ABNORMAL HIGH (ref 1.80–8.10)
Neutrophils: 70 %
Nucleated RBC: 0 /100 WBC (ref 0–1)
Platelets: 193 10*3/uL (ref 140–400)
RBC: 3.62 10*6/uL — ABNORMAL LOW (ref 4.20–5.40)
RDW: 14 % (ref 12–15)
WBC: 13.58 10*3/uL — ABNORMAL HIGH (ref 3.50–10.80)

## 2013-11-22 NOTE — Progress Notes (Signed)
Anesthesia Pain Service Progress Note      Patient Name:     [name] West    Procedure/Chief Complaint:     Procedure(s):  CESAREAN SECTION  Rupture of Membranes Evaluation      Date Details:     1 Day Post-Op  Infusion day # 0    Pain Management Method:     Subarachnoid Morphine:  Medication: Duramorph 0.2 mg    Vitals:     Temp: Temp: 36.6 C (97.9 F)   HR: Heart Rate: 73   BP: BP: 128/76 mmHg   RR: Resp Rate: 18   SpO2 SpO2: 97 %       Catheter Site:     N/A    Neurologic Status:     Motor Block  No and Sensory Block  No    Complaints/Side Effects:     Itching    Mental Status:     awake and alert     Respiratory Status:     tolerating room air    Pain (Assessment and Plan):     Patient not complaining of pain, continue current therapy         Carlene Coria, MD  11/22/2013 8:15 AM

## 2013-11-22 NOTE — Progress Notes (Signed)
CESAREAN SECTION POST-OP DAY #1        Date Time: 11/22/2013 1:07 PM  Patient Name:   [name] West      Date of Operation:   11/21/2013    Subjective:     Pt without complaint. Pain well controlled with PO pain meds. Tolerating regular diet, ambulating and voiding without difficulty. Minimal lochia rubra. Breastfeeding.      Objective:   BP 128/76 mmHg  Pulse 73  Temp(Src) 97.9 F (36.6 C) (Oral)  Resp 18  Ht 1.626 m (5\' 4" )  Wt 65.772 kg (145 lb)  BMI 24.88 kg/m2  SpO2 97%  Breastfeeding? Yes      Gen: NAD  Abd: S, NT, ND     FF/NT @U   Inc: c/d/i   Ext: trace edema, no Homan's      Recent Labs  Lab 11/22/13  0448 11/21/13  0123   WBC 13.58* 11.71*   HEMOGLOBIN 10.0* 11.1*   HEMATOCRIT 30.4* 32.5*   PLATELETS 193 295       Assessment/Plan:      A: POD #1 s/p C/S secondary to Homero Fellers breech prsentation    P: ambulate  Lactation consult  Iron supplement  Routine postop/postpartum care          Norman Herrlich, MD  11/22/2013  1:07 PM

## 2013-11-22 NOTE — Anesthesia Postprocedure Evaluation (Addendum)
Anesthesia Post Evaluation    Patient: [name] West    Procedures performed: Procedure(s):  CESAREAN SECTION    Anesthesia type: Spinal    Patient location:[facility]    Last vitals:   Filed Vitals:    11/22/13 0802   BP: 128/76   Pulse: 73   Temp: 36.6 C (97.9 F)   Resp: 18   SpO2: 97       Post pain: Patient not complaining of pain, continue current therapy      Mental Status:awake and alert     Respiratory Function: tolerating room air    Cardiovascular: stable    Nausea/Vomiting: patient not complaining of nausea or vomiting    Hydration Status: adequate    Post assessment: no apparent anesthetic complications, no reportable events and no evidence of recall

## 2013-11-22 NOTE — Plan of Care (Signed)
POC reviewed for tonight. D/c'd foley, pt encouraged to call for assistance to bathroom x2. Fundus firm at U-1 with scant lochia rubra. Will continue to monitor.

## 2013-11-22 NOTE — Plan of Care (Signed)
Alert and awake vital signs are within normal parameters..Postpartum check are normal,denies pain.

## 2013-11-23 ENCOUNTER — Encounter: Payer: Self-pay | Admitting: Anesthesiology

## 2013-11-23 MED ORDER — LANOLIN EX OINT
TOPICAL_OINTMENT | CUTANEOUS | Status: AC | PRN
Start: 2013-11-23 — End: ?

## 2013-11-23 MED ORDER — OXYCODONE-ACETAMINOPHEN 5-325 MG PO TABS
2.0000 | ORAL_TABLET | ORAL | Status: DC | PRN
Start: 2013-11-23 — End: 2017-06-25

## 2013-11-23 MED ORDER — SENNOSIDES-DOCUSATE SODIUM 8.6-50 MG PO TABS
2.0000 | ORAL_TABLET | Freq: Every evening | ORAL | Status: AC | PRN
Start: 2013-11-23 — End: ?

## 2013-11-23 MED ORDER — IBUPROFEN 600 MG PO TABS
600.0000 mg | ORAL_TABLET | Freq: Four times a day (QID) | ORAL | Status: AC
Start: 2013-11-23 — End: ?

## 2013-11-23 NOTE — Lactation Note (Signed)
Followed up with mother at request of RN.  Gave pt nipple shield with written and verbal instructions.  Mother is aware of need to pump and monitor baby's wt if shield is used.  Demonstrated how to attach shield. Attempted to help latch baby with shield but mother did not find it comfortable to use the shield.  Latched baby without the shield in c/c position.  Baby was able to suck off and on many times during this visit.  Mother reported latch was comfortable.  RN will initiate breast pump use with pt.  Recommended mother use hand express to give baby drops to EBM.  Will follow up as needed.  Darol Destine, RN, IBCLC

## 2013-11-23 NOTE — Lactation Note (Signed)
Mother and baby attended breast feeding class.  Educated on feeding cues, feeding baby on demand, cluster feeding, benefits of skin to skin.  Also educated on how to position and latch baby and how to know if you have a good latch.  Demonstrated asymmetric latch.  Discussed how to know if baby is eating enough by monitoring diaper changes and baby's wt.  Demonstrated and gave written instructions for hand expression.  Educated on how milk supply is established and maintained. Gave list of  resources for help with breastfeeding for after discharge from the hospital.  Mother held baby skin to skin but baby was too sleepy to latch during class.  All current questions addressed.  Encouraged pt to call for further assist prn.  [name] Hemberger, RN, IBCLC

## 2013-11-23 NOTE — Plan of Care (Signed)
Problem: Breast Care  Goal: Breasts are soft with nipple integrity intact-vaginal delivery-recovery and post partum.  Outcome: Progressing  Mother assisted throughout shift by RN and LC with breastfeeding. Mother encouraged to call when assistance was needed during feedings. Mother attended breastfeeding class this am. Mother instructed on how to use the nipple shield. Will continue to offer assistance and will educate the patient on how to properly use pump.

## 2013-11-23 NOTE — Discharge Summary (Signed)
Obstetrical Discharge Form    Primary OB Clinician:  Lorenza Evangelist MD     [facility]: Estimated Date of Delivery: 11/26/13    Gestational Age:[redacted]w[redacted]d    Antepartum complications:  Breech presentation      Spontaneous rupture of membranes    Date of Delivery: 11/21/2013  Time of Delivery:  3:42 AM      Delivered By:  Lorenza Evangelist MD    Delivery Type: Cesarean     Tubal Ligation: No    Baby: Liveborn Female ; Apgar 1 minute: 9  Apgar 5 minute: 9 ; Birth weight: 7 lb 13.6 oz (3560 g)     Anesthesia: Spinal     Delivery complications: None     Laceration: None      Episiotomy: None     Placenta:   11/21/2013   3:44 AM   Manual removal   Intact     Feeding method:  Both breast/bottle feeding    Tdap vaccine given:  yes    Discharge Date/Time: 11/24/2013    Hospital Course    Admitted @ 39.2 weeks with frank breech presentation complicated by spontaneous rupture of membranes   She subsequently had uncomplicated primary cesarean section. See previously dictated op report.   Post op course was uneventful.Pt tolerated regular diet, had good pain control.    Plan:     Follow-up appointment in 2 weeks.      Lorenza Evangelist MD

## 2013-11-23 NOTE — Lactation Note (Signed)
Followed up with mother at request of RN.  Assessment of baby's oral anatomy reveals possible short frenulum.  Showed mother how to hold and latch baby in football position.  Baby sucked a few times off and on but did not maintain the latch well during this visit.  Mother's breasts are filling.  Mother is able to hand express big drops of colostrum.  Recommended mother give baby drops of EMB.  If baby is sucking on her own tongue allow baby to suck on mother's finger.  Reviewed how to know if baby is eating enough by monitoring diaper changes and baby's wt.  Recommended mother pump her breasts if baby is unable to latch effectively.  Encouraged pt to call for further assist as needed.  Spoke with RN, Maddie about possible need to initiate breast pump use.  Will follow up as needed. Darol Destine, RN, IBCLC

## 2013-11-23 NOTE — Anesthesia Preprocedure Evaluation (Addendum)
Anesthesia Evaluation    AIRWAY    Mallampati: II    TM distance: >3 FB  Neck ROM: full  Mouth Opening:full   CARDIOVASCULAR    cardiovascular exam normal, regular and normal       DENTAL    no notable dental hx     PULMONARY    pulmonary exam normal and clear to auscultation     OTHER FINDINGS    Patient presents for C-section; first baby; breech presentation and ROM    Discussed with patient spinal anesthesia for C-section and preference over epidural with risk to include but not limited to infection, PDPHA, backache, remote risk of nerve damage.  Questions answered.            PSS Anesthesia Comments: Discussed with patient spinal anesthesia for C-section and preference over epidural with risk to include but not limited to infection, PDPHA, backache, remote risk of nerve damage.  Questions answered.        Anesthesia Plan    ASA 2     spinal               (Discussed with patient spinal anesthesia for C-section and preference over epidural with risk to include but not limited to infection, PDPHA, backache, remote risk of nerve damage.  Questions answered.)      Detailed anesthesia plan: spinal  Monitors/Adjuncts: other (BP cuff, pulse oximeter, EKG)    Post Op: other (L&D PACU)  Post op pain management: intrathecal    informed consent obtained      pertinent labs reviewed

## 2013-11-23 NOTE — Progress Notes (Signed)
CESAREAN SECTION POST-OP DAY #2        Date Time: 11/23/2013 12:50 PM  Patient Name:   [name] West      Date of Operation:   11/21/2013       Subjective:     Pt without complaint. Pain well controlled with PO pain meds. Tolerating regular diet, ambulating and voiding without difficulty. Minimal lochia rubra. Breastfeeding.      Objective:   BP 123/75 mmHg  Pulse 73  Temp(Src) 97.7 F (36.5 C) (Oral)  Resp 18  Ht 1.626 m (5\' 4" )  Wt 65.772 kg (145 lb)  BMI 24.88 kg/m2  SpO2 97%  Breastfeeding? Yes      Gen: NAD  Abd: S, NT, ND     FF/NT @U   Inc: c/d/i   Ext: trace edema, no Homan's      Recent Labs  Lab 11/22/13  0448 11/21/13  0123   WBC 13.58* 11.71*   HEMOGLOBIN 10.0* 11.1*   HEMATOCRIT 30.4* 32.5*   PLATELETS 193 295       Assessment/Plan:      A: POD #2 s/p C/S    P: ambulate  Lactation consult  Iron supplement  Routine postop/postpartum care                                                                                Norman Herrlich, MD  11/23/2013  12:50 PM

## 2013-11-23 NOTE — Plan of Care (Signed)
Problem: Pain/Discomfort  Goal: Pain/discomfort is manageable.  Outcome: Progressing  Pain well managed with motrin and percocet as needed. VS wnl. Pt showered and is ambulating in room. Encouraged to call for assistance, pumping guidelines reviewed with pt, incision is CDI, ff@u -2, scant bleeding. POC discussed.

## 2013-11-24 ENCOUNTER — Encounter
Admission: AD | Disposition: A | Discharge status: Home / Self Care | Payer: Self-pay | Source: Ambulatory Visit | Attending: Obstetrics and Gynecology

## 2013-11-24 SURGERY — Surgical Case
Anesthesia: Spinal

## 2013-11-24 NOTE — Plan of Care (Signed)
Plan of care completed and reviewed. [place] instructions and tdap given, incision opened up about 1/2 in on right side, steri strips applied, Dr. Katheren Shams aware, dicussed incisional care, breastfeeding, pain management, safety, activity, follow up and when to call MD with concerns.

## 2014-12-27 IMAGING — CR DG ELBOW COMPLETE 3+V*L*
4 series · 4 of 4 positions shown · non-contrast
Comparison: None.

CLINICAL DATA: Laceration secondary to fall at work.

LEFT ELBOW - COMPLETE 3+ VIEW

[x elbow joint ap left]
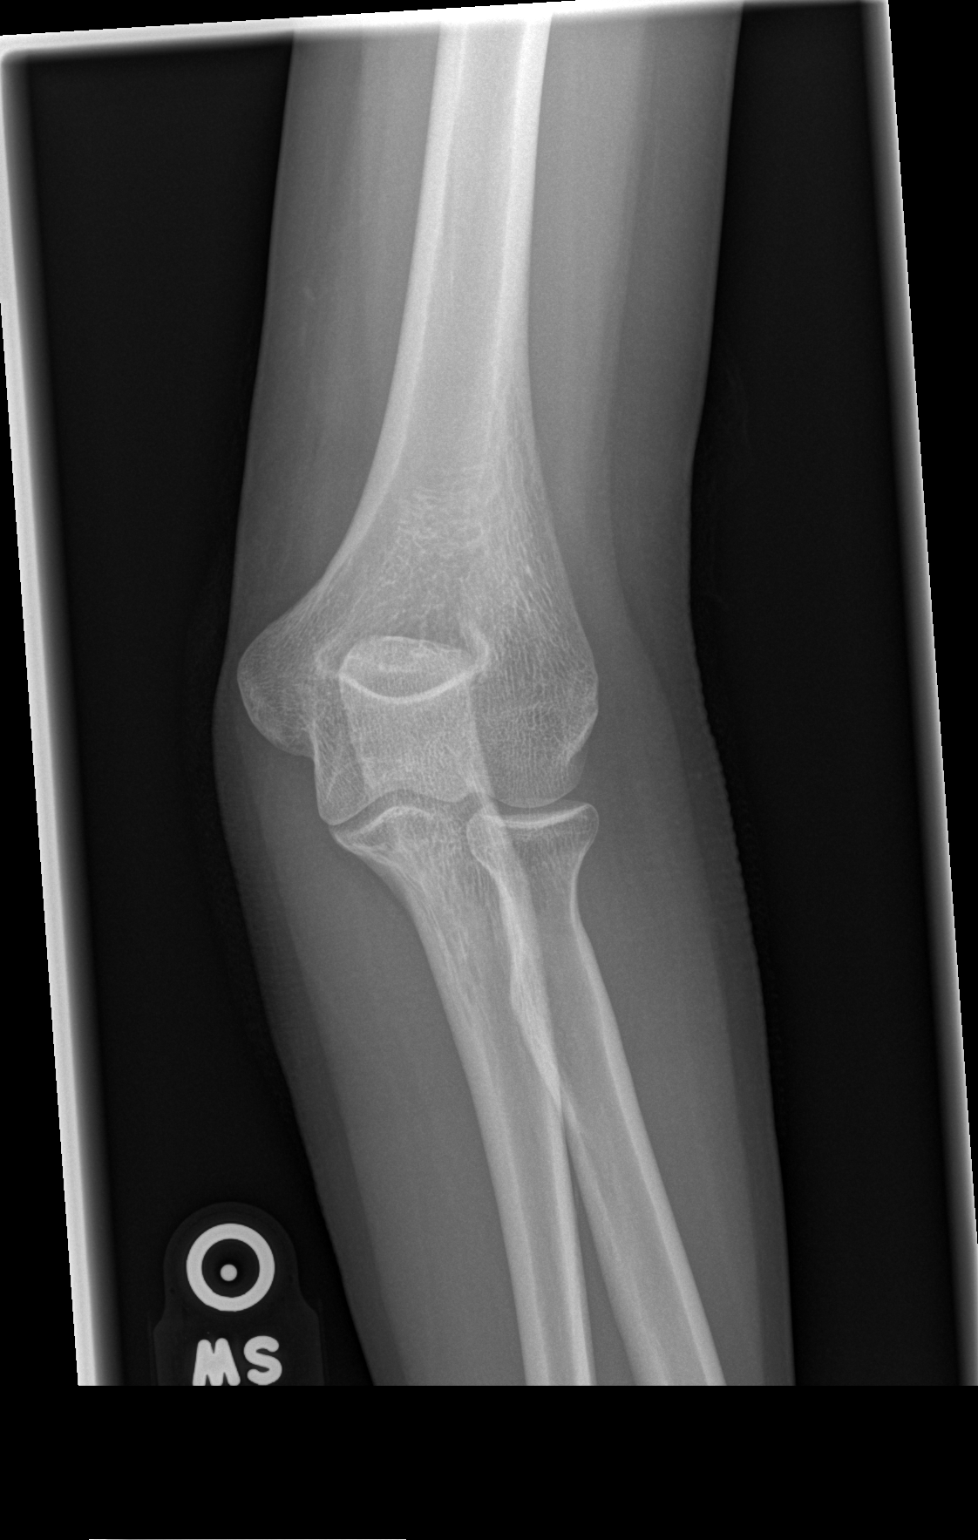

[x elbow joint obl. left (1 of 2)]
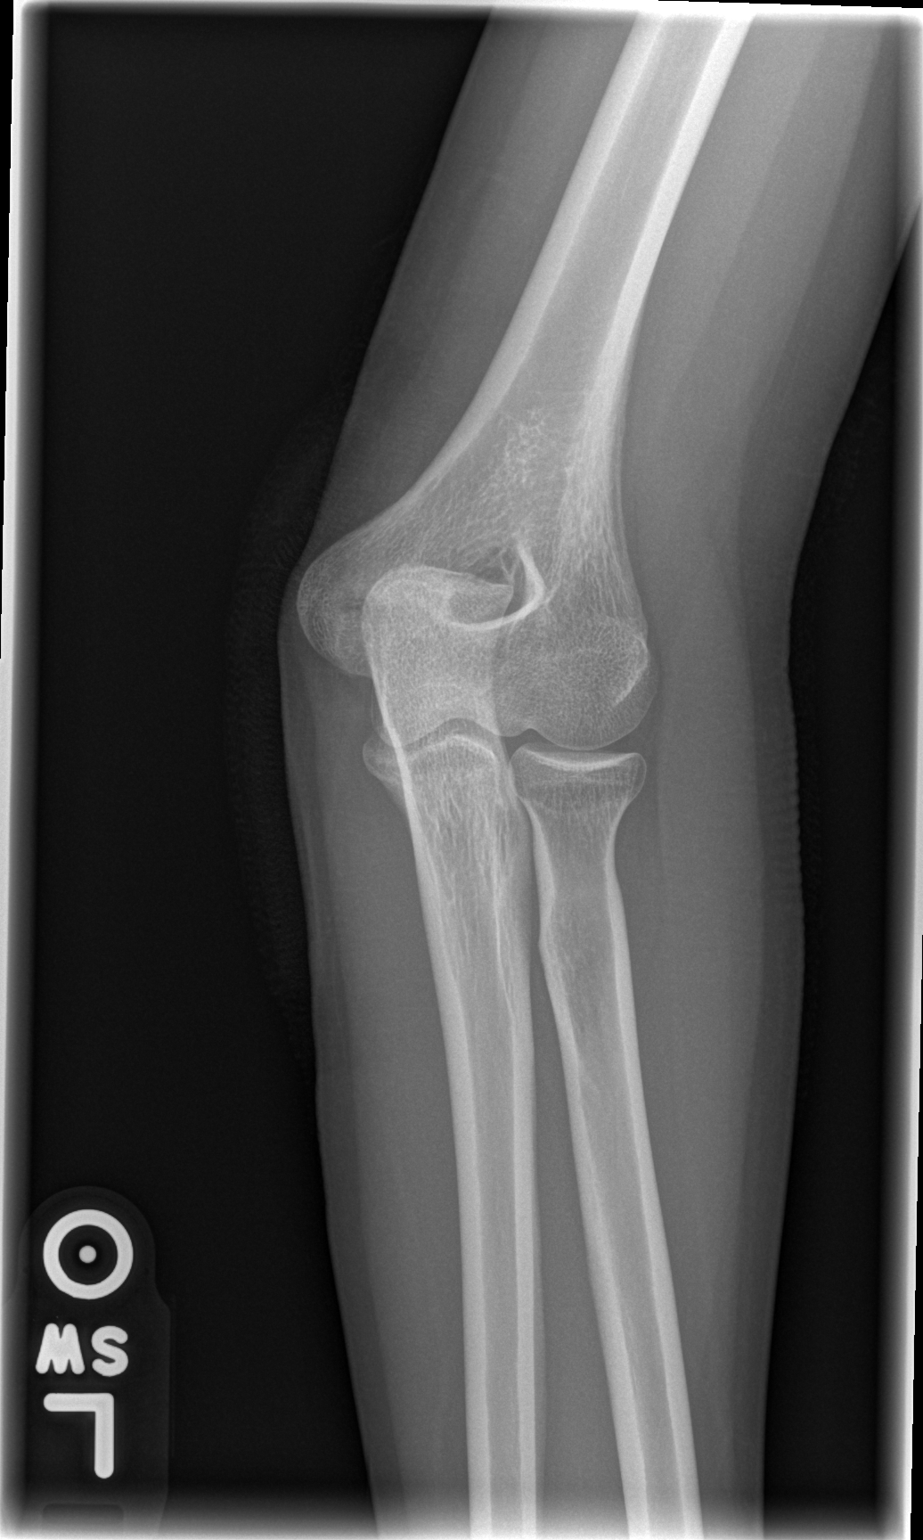

[x elbow joint obl. left (2 of 2)]
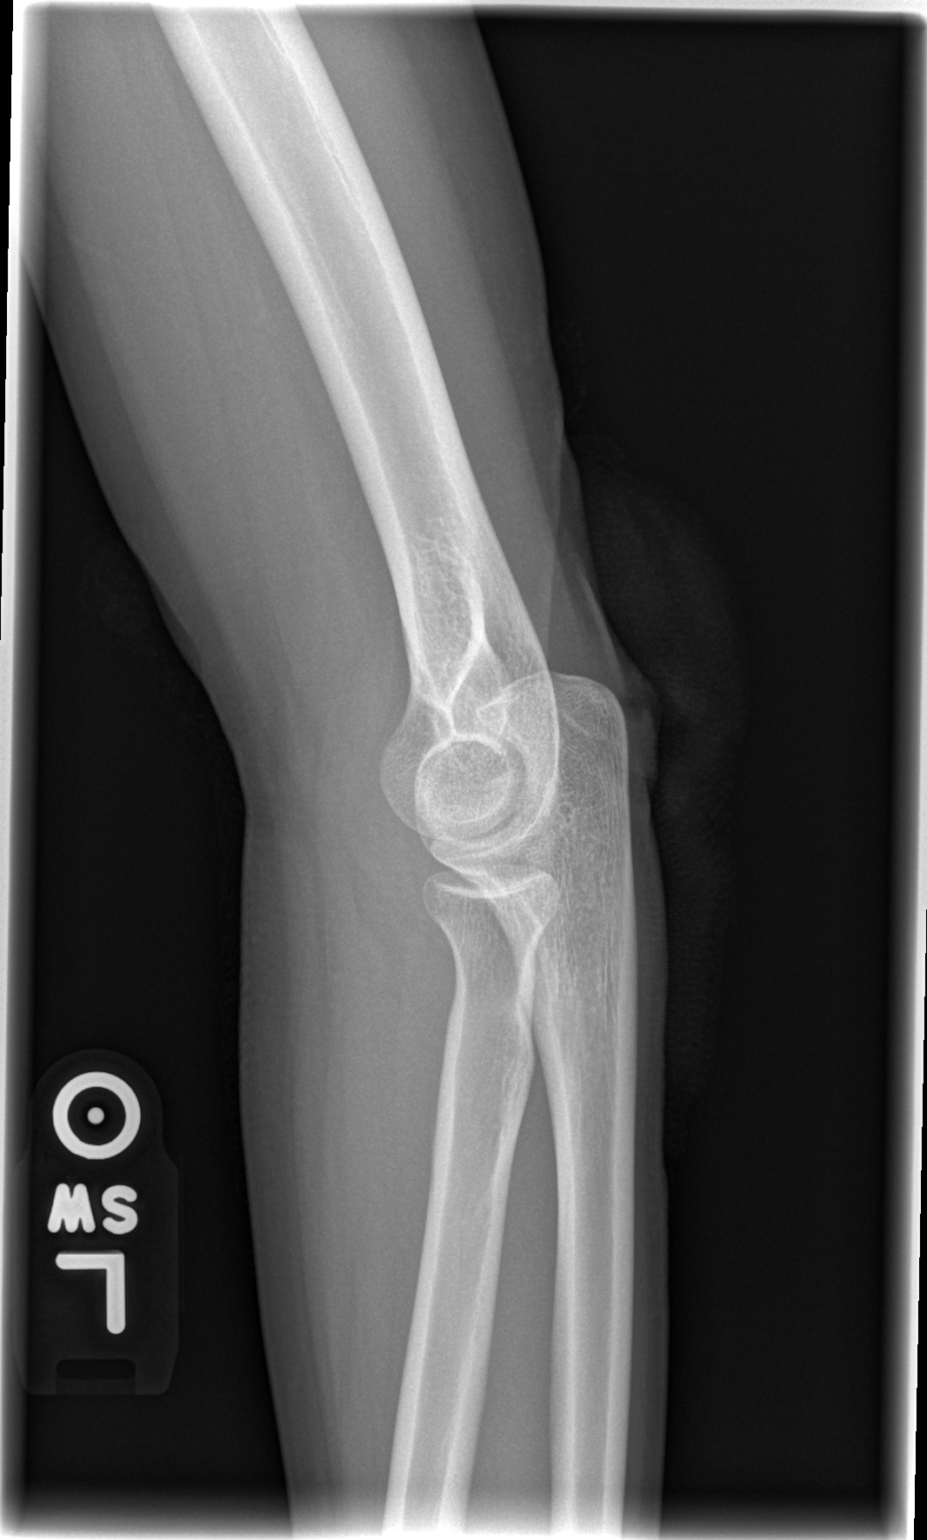

[x elbow joint lat left]
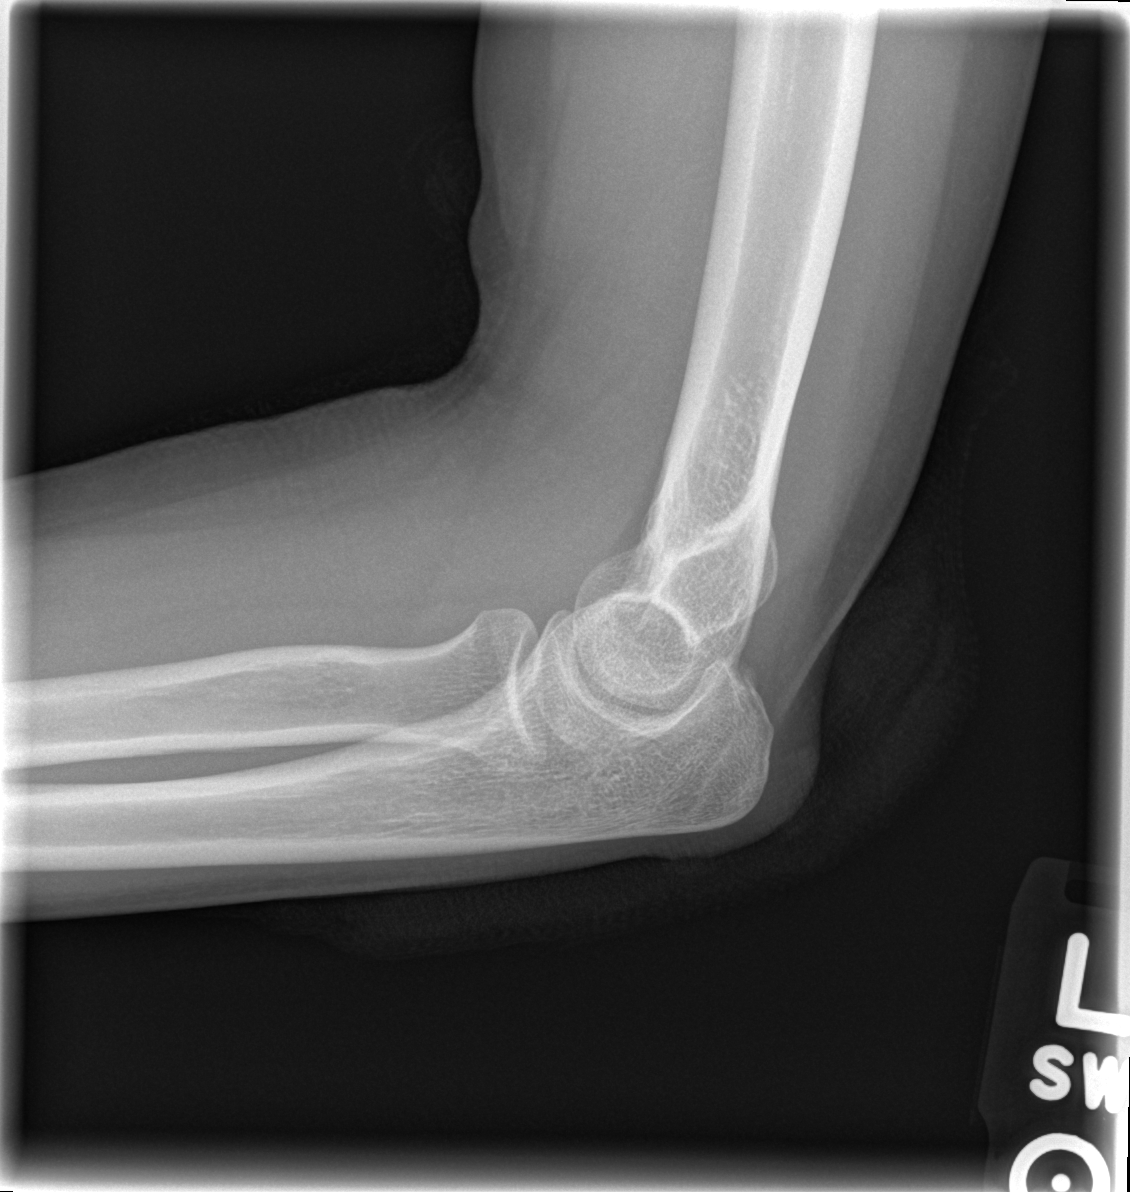

[4 of 4 positions shown; findings below may reference images not displayed]

FINDINGS: There is a laceration overlying the olecranon process.
No radiodense foreign body.  No osseous abnormality including
fracture.  No joint effusion.
IMPRESSION: Posterior elbow laceration.  No radiodense foreign body or osseous
abnormality.

## 2016-02-12 ENCOUNTER — Encounter (HOSPITAL_COMMUNITY): Payer: Self-pay | Admitting: Obstetrics and Gynecology

## 2017-03-08 ENCOUNTER — Encounter: Payer: Self-pay | Admitting: Emergency Medicine

## 2017-03-08 ENCOUNTER — Emergency Department (INDEPENDENT_AMBULATORY_CARE_PROVIDER_SITE_OTHER)
Admission: EM | Admit: 2017-03-08 | Discharge: 2017-03-08 | Disposition: A | Discharge status: Home / Self Care | Payer: 59 | Source: Home / Self Care | Attending: Family Medicine | Admitting: Family Medicine

## 2017-03-08 ENCOUNTER — Other Ambulatory Visit: Payer: Self-pay

## 2017-03-08 DIAGNOSIS — J019 Acute sinusitis, unspecified: Secondary | ICD-10-CM

## 2017-03-08 DIAGNOSIS — B9689 Other specified bacterial agents as the cause of diseases classified elsewhere: Secondary | ICD-10-CM

## 2017-03-08 DIAGNOSIS — J069 Acute upper respiratory infection, unspecified: Secondary | ICD-10-CM

## 2017-03-08 LAB — POCT INFLUENZA A/B
Influenza A, POC: NEGATIVE
Influenza B, POC: NEGATIVE

## 2017-03-08 MED ORDER — BENZONATATE 100 MG PO CAPS: 100.0000 mg | ORAL_CAPSULE | Freq: Three times a day (TID) | ORAL | 0 refills | Status: AC

## 2017-03-08 MED ORDER — AMOXICILLIN-POT CLAVULANATE 875-125 MG PO TABS: 1.0000 | ORAL_TABLET | Freq: Two times a day (BID) | ORAL | 0 refills | Status: AC

## 2017-03-08 MED ORDER — FLUTICASONE PROPIONATE 50 MCG/ACT NA SUSP: 2.0000 | Freq: Every day | NASAL | 2 refills | Status: AC

## 2017-03-08 NOTE — ED Provider Notes (Signed)
[facility]    CSN: [id number] Arrival date & time: 03/08/17  1738     History   Chief Complaint Chief Complaint  Patient presents with  . Nasal Congestion  . Generalized Body Aches    HPI Deborah Sweeney is a 26 y.o. female.   HPI  Deborah ChyleBrittany Janowski is a 26 y.o. female presenting to UC with c/o waxing and waning cough, congestion, and sinus pain/pressure for about 4 weeks.  She had a low grade fever the first week and earlier today she had a "hot flash."  She has been trying OTC cough/cold medications w/o much relief.  Congestion and fatigue have been worsening.  She is requesting a flu test.  Denies recent travel. Others around her have had mild cough and congestion but no one has been sick for as long as she has.  Denies n/v/d. Denies chest pain or SOB.   Past Medical History:  Diagnosis Date  . Pinched nerve     There are no active problems to display for this patient.   Past Surgical History:  Procedure Laterality Date  . ADENOIDECTOMY    . CESAREAN SECTION    . MYRINGOTOMY    . TONSILLECTOMY      OB History    No data available       Home Medications    Prior to Admission medications   Medication Sig Start Date End Date Taking? Authorizing Provider  amoxicillin-clavulanate (AUGMENTIN) 875-125 MG tablet Take 1 tablet by mouth 2 (two) times daily. One po bid x 7 days 03/08/17   Lurene ShadowPhelps, Supriya Beaston O, PA-C  benzonatate (TESSALON) 100 MG capsule Take 1-2 capsules (100-200 mg total) by mouth every 8 (eight) hours. 03/08/17   Lurene ShadowPhelps, Cosme Jacob O, PA-C  fluticasone (FLONASE) 50 MCG/ACT nasal spray Place 2 sprays into both nostrils daily. 03/08/17   Lurene ShadowPhelps, Ligia Duguay O, PA-C  levonorgestrel-ethinyl estradiol (AVIANE,ALESSE,LESSINA) 0.1-20 MG-MCG tablet Take 1 tablet by mouth daily.    [provider]    Family History Family History  Problem Relation Age of Onset  . Hypertension Mother   . Stroke Father   . Heart attack Other     Social History Social  History   Tobacco Use  . Smoking status: Current Every Day Smoker    Packs/day: 0.50    Types: Cigarettes  . Smokeless tobacco: Never Used  Substance Use Topics  . Alcohol use: Yes    Comment: once a week  . Drug use: Yes    Types: Marijuana    Comment: seldom     Allergies   Patient has no known allergies.   Review of Systems Review of Systems  Constitutional: Positive for chills, fatigue and fever. Negative for diaphoresis.  HENT: Positive for congestion, ear pain (bilateral pressure), postnasal drip, sinus pressure, sinus pain and sore throat (scratchy). Negative for trouble swallowing and voice change.   Respiratory: Positive for cough. Negative for shortness of breath.   Cardiovascular: Negative for chest pain and palpitations.  Gastrointestinal: Negative for abdominal pain, diarrhea, nausea and vomiting.  Musculoskeletal: Negative for arthralgias, back pain and myalgias.  Skin: Negative for rash.  Neurological: Positive for headaches. Negative for dizziness and light-headedness.     Physical Exam Triage Vital Signs ED Triage Vitals [03/08/17 1758]  Enc Vitals Group     BP 114/72     Pulse Rate 89     Resp 16     Temp 98.2 F (36.8 C)     Temp Source Oral  SpO2      Weight 110 lb (49.9 kg)     Height 5\' 4"  (1.626 m)     Head Circumference      Peak Flow      Pain Score      Pain Loc      Pain Edu?      Excl. in GC?    No data found.  Updated Vital Signs BP 114/72 (BP Location: Right Arm)   Pulse 89   Temp 98.2 F (36.8 C) (Oral)   Resp 16   Ht 5\' 4"  (1.626 m)   Wt 110 lb (49.9 kg)   BMI 18.88 kg/m   Visual Acuity Right Eye Distance:   Left Eye Distance:   Bilateral Distance:    Right Eye Near:   Left Eye Near:    Bilateral Near:     Physical Exam  Constitutional: She is oriented to person, place, and time. She appears well-developed and well-nourished. No distress.  HENT:  Head: Normocephalic and atraumatic.  Right Ear: Tympanic  membrane normal.  Left Ear: Tympanic membrane normal.  Nose: Mucosal edema present. Right sinus exhibits maxillary sinus tenderness and frontal sinus tenderness. Left sinus exhibits maxillary sinus tenderness and frontal sinus tenderness.  Mouth/Throat: Uvula is midline, oropharynx is clear and moist and mucous membranes are normal.  Eyes: EOM are normal.  Neck: Normal range of motion. Neck supple.  Cardiovascular: Normal rate and regular rhythm.  Pulmonary/Chest: Effort normal and breath sounds normal. No stridor. No respiratory distress. She has no wheezes. She has no rales.  Musculoskeletal: Normal range of motion.  Lymphadenopathy:    She has no cervical adenopathy.  Neurological: She is alert and oriented to person, place, and time.  Skin: Skin is warm and dry. She is not diaphoretic.  Psychiatric: She has a normal mood and affect. Her behavior is normal.  Nursing note and vitals reviewed.    UC Treatments / Results  Labs (all labs ordered are listed, but only abnormal results are displayed) Labs Reviewed  POCT INFLUENZA A/B    EKG  EKG Interpretation None       Radiology No results found.  Procedures Procedures (including critical care time)  Medications Ordered in UC Medications - No data to display   Initial Impression / Assessment and Plan / UC Course  I have reviewed the triage vital signs and the nursing notes.  Pertinent labs & imaging results that were available during my care of the patient were reviewed by me and considered in my medical decision making (see chart for details).     Hx and exam c/w sinusitis secondary to URI Flu test: NEGATIVE  Due to duration and worsening of symptoms, will cover for underlying bacterial infection Home care instructions provided F/u with PCP in 1 week if not improving.   Final Clinical Impressions(s) / UC Diagnoses   Final diagnoses:  Acute bacterial rhinosinusitis  Upper respiratory tract infection,  unspecified type    ED Discharge Orders        Ordered    amoxicillin-clavulanate (AUGMENTIN) 875-125 MG tablet  2 times daily     03/08/17 1810    benzonatate (TESSALON) 100 MG capsule  Every 8 hours     03/08/17 1810    fluticasone (FLONASE) 50 MCG/ACT nasal spray  Daily     03/08/17 1810       Controlled Substance Prescriptions  Controlled Substance Registry consulted? Not Applicable   Rolla Platehelps, Haylo Fake O, PA-C 03/09/17 [id number]

## 2017-03-08 NOTE — ED Triage Notes (Signed)
Patient has felt ill intermittently for 4 weeks; low grade fever first week; now sense of hot flashes; ongoing congestion; fatigue. Wants to be tested for influenza.

## 2017-03-11 ENCOUNTER — Telehealth: Payer: Self-pay

## 2017-03-11 NOTE — Telephone Encounter (Signed)
Left message on VM to call if any questions or or concerns.  Contact information given.
